# Patient Record
Sex: Female | Born: 1995 | Race: Black or African American | Hispanic: No | Marital: Single | Smoking: Never smoker
Health system: Southern US, Community
[De-identification: ages and names within clinical notes are randomized; demographics above are authoritative.]

## PROBLEM LIST (undated history)

## (undated) DIAGNOSIS — J309 Allergic rhinitis, unspecified: Secondary | ICD-10-CM

## (undated) DIAGNOSIS — D649 Anemia, unspecified: Secondary | ICD-10-CM

## (undated) DIAGNOSIS — J039 Acute tonsillitis, unspecified: Secondary | ICD-10-CM

## (undated) DIAGNOSIS — E669 Obesity, unspecified: Secondary | ICD-10-CM

## (undated) DIAGNOSIS — J45909 Unspecified asthma, uncomplicated: Secondary | ICD-10-CM

## (undated) HISTORY — PX: NO PAST SURGERIES: SHX2092

## (undated) HISTORY — PX: WISDOM TOOTH EXTRACTION: SHX21

## (undated) HISTORY — DX: Unspecified asthma, uncomplicated: J45.909

---

## 2017-05-08 ENCOUNTER — Ambulatory Visit
Admission: EM | Admit: 2017-05-08 | Discharge: 2017-05-08 | Disposition: A | Discharge status: Home / Self Care | Payer: Medicaid Other | Attending: Emergency Medicine | Admitting: Emergency Medicine

## 2017-05-08 ENCOUNTER — Encounter: Payer: Self-pay | Admitting: Gynecology

## 2017-05-08 DIAGNOSIS — Z3202 Encounter for pregnancy test, result negative: Secondary | ICD-10-CM | POA: Diagnosis not present

## 2017-05-08 DIAGNOSIS — R197 Diarrhea, unspecified: Secondary | ICD-10-CM | POA: Insufficient documentation

## 2017-05-08 DIAGNOSIS — R112 Nausea with vomiting, unspecified: Secondary | ICD-10-CM | POA: Insufficient documentation

## 2017-05-08 LAB — URINALYSIS, COMPLETE (UACMP) WITH MICROSCOPIC
BILIRUBIN URINE: NEGATIVE
GLUCOSE, UA: NEGATIVE mg/dL
Hgb urine dipstick: NEGATIVE
KETONES UR: NEGATIVE mg/dL
NITRITE: NEGATIVE
PH: 7 (ref 5.0–8.0)
Protein, ur: 30 mg/dL — AB
Specific Gravity, Urine: 1.02 (ref 1.005–1.030)

## 2017-05-08 LAB — PREGNANCY, URINE: PREG TEST UR: NEGATIVE

## 2017-05-08 MED ORDER — ONDANSETRON 8 MG PO TBDP
8.0000 mg | ORAL_TABLET | Freq: Once | ORAL | Status: AC
  Administered 2017-05-08: 8 mg via ORAL

## 2017-05-08 MED ORDER — ONDANSETRON 8 MG PO TBDP: 8.0000 mg | ORAL_TABLET | Freq: Three times a day (TID) | ORAL | 0 refills | Status: DC | PRN

## 2017-05-08 NOTE — ED Provider Notes (Signed)
HPI  SUBJECTIVE:  Gabrielle Byrd is a 21 y.o. female who presents with nonbilious, nonbloody emesis and watery diarrhea starting yesterday. She reports 4-5 episodes of vomiting yesterday and 2 today. She reports over 6 episodes of diarrhea yesterday and about 6 episodes today. She reports fevers 101 yesterday. She states that she is tolerating liquids but unable to keep any solids down. She reports diffuse burning intermittent abdominal pain lasting seconds that is worse before vomiting and having a bowel movement and better afterwards. Has no other abdominal pain at any other time.  She tried over-the-counter cold and flu medicine which she kept down without improvement in her symptoms. No change in urine output. No recent travel, medications, new foods, raw or undercooked foods, questionable leftovers. No exposure to reptiles, chickens, sick contacts although she works at [employer]. No coworkers with similar symptoms. No recent antibiotics. She does not take any supplements. She denies abdominal distention. No coughing, wheezing, chest pain, shortness of breath. No urinary complaints, change in urine output. No sore throat, ear pain, excessive thirst, lightheadedness, dizziness. No melena, hematochezia. No antipyretic in the past 6-8 hours. Past medical history negative for diabetes, hypertension, abdominal surgeries, pancreatitis, NSAID use, alcohol abuse, gallstones. LMP: 7/2. [id number], Chyrl Civatte, MD   History reviewed. No pertinent past medical history.  History reviewed. No pertinent surgical history.  Family History  Problem Relation Age of Onset  . Asthma Mother   . Rheum arthritis Mother   . Hypertension Mother   . Heart failure Father   . Diabetes Maternal Grandmother   . Rheum arthritis Maternal Grandmother   . Heart failure Maternal Grandfather   . Cancer Maternal Grandfather   . Rheum arthritis Maternal Grandfather     Social History  Substance Use Topics  . Smoking  status: Never Smoker  . Smokeless tobacco: Never Used  . Alcohol use No    No current facility-administered medications for this encounter.   Current Outpatient Prescriptions:  .  ondansetron (ZOFRAN ODT) 8 MG disintegrating tablet, Take 1 tablet (8 mg total) by mouth every 8 (eight) hours as needed for nausea or vomiting., Disp: 20 tablet, Rfl: 0  No Known Allergies   ROS  As noted in HPI.   Physical Exam  BP 115/62 (BP Location: Left Arm)   Pulse 66   Temp 98.6 F (37 C) (Oral)   Resp 16   Ht 5\' 3"  (1.6 m)   Wt 229 lb (103.9 kg)   LMP 04/19/2017   SpO2 100%   BMI 40.57 kg/m   Orthostatic VS for the past 24 hrs:  BP- Lying Pulse- Lying BP- Sitting Pulse- Sitting BP- Standing at 0 minutes Pulse- Standing at 0 minutes  05/08/17 1632 120/55 68 109/59 71 113/55 75    Constitutional: Well developed, well nourished, no acute distress Eyes:  EOMI, conjunctiva normal bilaterally HENT: Normocephalic, atraumatic,mucus membranes moist Respiratory: Normal inspiratory effort Lungs clear bilaterally Cardiovascular: Normal rate regular rhythm no murmurs rubs gallops. Cap refill less than 2 seconds GI: nondistended soft, nontender. Mild periumbilical tenderness to deep palpation. No other abdominal tenderness Negative Murphy, negative McBurney Back: No CVA tenderness skin: No rash, skin intact Musculoskeletal: no deformities Neurologic: Alert & oriented x 3, no focal neuro deficits Psychiatric: Speech and behavior appropriate   ED Course   Medications  ondansetron (ZOFRAN-ODT) disintegrating tablet 8 mg (8 mg Oral Given 05/08/17 1639)    Orders Placed This Encounter  Procedures  . Urine culture    Standing Status:  Standing    Number of Occurrences:   1    Order Specific Question:   List patient's active antibiotics    Answer:   none    Order Specific Question:   Patient immune status    Answer:   Normal  . Pregnancy, urine    Standing Status:   Standing    Number  of Occurrences:   1  . Urinalysis, Complete w Microscopic    Standing Status:   Standing    Number of Occurrences:   1  . Orthostatic vital signs    Standing Status:   Standing    Number of Occurrences:   1  . Offer Fluids    Standing Status:   Standing    Number of Occurrences:   20    Results for orders placed or performed during the hospital encounter of 05/08/17 (from the past 24 hour(s))  Pregnancy, urine     Status: None   Collection Time: 05/08/17  4:18 PM  Result Value Ref Range   Preg Test, Ur NEGATIVE NEGATIVE  Urinalysis, Complete w Microscopic     Status: Abnormal   Collection Time: 05/08/17  4:18 PM  Result Value Ref Range   Color, Urine YELLOW YELLOW   APPearance CLOUDY (A) CLEAR   Specific Gravity, Urine 1.020 1.005 - 1.030   pH 7.0 5.0 - 8.0   Glucose, UA NEGATIVE NEGATIVE mg/dL   Hgb urine dipstick NEGATIVE NEGATIVE   Bilirubin Urine NEGATIVE NEGATIVE   Ketones, ur NEGATIVE NEGATIVE mg/dL   Protein, ur 30 (A) NEGATIVE mg/dL   Nitrite NEGATIVE NEGATIVE   Leukocytes, UA MODERATE (A) NEGATIVE   Squamous Epithelial / LPF 21-50 (A) NONE SEEN   WBC, UA 21-50 0 - 5 WBC/hpf   RBC / HPF 0-5 0 - 5 RBC/hpf   Bacteria, UA FEW (A) NONE SEEN   No results found.  ED Clinical Impression  Nausea vomiting and diarrhea   ED Assessment/Plan  Giving Zofran 8 mg, oral challenge. She does not have any evidence of a surgical abdomen at this time. She is afebrile and she has not had any antipyretic in the past 6-8 hours. Checking orthostatics, UA, urine pregnancy, CMP, lipase, CBC.  Will reevaluate.  Urine pregnancy negative.   Staff was unable to draw lab work, but in the absence of abnormal vital signs, specific abdominal tenderness or change in urine output, feel that we do not necessarily need to get them today. Her urine was noted with moderate leukocytes however it is a contaminated specimen. She has no urinary complaints, so we'll send this off for culture prior to  initiating abx,   She was tolerating Pedialyte prior to discharge. She did not have any abdominal pain while here.  Cannot clearly identify any cause of her symptoms, so we'll not do stool cultures at this time. Feel this is most likely a viral gastroenteritis. Patient is not orthostatic, does not appear to be severely dehydrated.   will attempt oral rehydration at home, send home with antiemetics.  Discussed medical decision making, plan for follow-up, signs and symptoms that should prompt return to the ER.  Patient agrees with plan.  Discussed labs, imaging, MDM, plan and followup with patient and parent. Discussed sn/sx that should prompt return to the ED. Patient agrees with plan.   Meds ordered this encounter  Medications  . ondansetron (ZOFRAN-ODT) disintegrating tablet 8 mg  . ondansetron (ZOFRAN ODT) 8 MG disintegrating tablet    Sig: Take 1  tablet (8 mg total) by mouth every 8 (eight) hours as needed for nausea or vomiting.    Dispense:  20 tablet    Refill:  0    *This clinic note was created using Scientist, clinical (histocompatibility and immunogenetics)Dragon dictation software. Therefore, there may be occasional mistakes despite careful proofreading.  ?   Domenick GongMortenson, Anely Spiewak, MD 05/08/17 1711

## 2017-05-08 NOTE — ED Triage Notes (Signed)
Per patient vomiting/ diarrhea x 2 days. Per patient fever x yesterday of 101.

## 2017-05-10 LAB — URINE CULTURE: SPECIAL REQUESTS: NORMAL

## 2018-01-04 ENCOUNTER — Ambulatory Visit (INDEPENDENT_AMBULATORY_CARE_PROVIDER_SITE_OTHER): Payer: Medicaid Other | Admitting: Certified Nurse Midwife

## 2018-01-04 ENCOUNTER — Encounter: Payer: Self-pay | Admitting: Certified Nurse Midwife

## 2018-01-04 VITALS — BP 120/66 | HR 89 | Ht 63.0 in | Wt 223.4 lb

## 2018-01-04 DIAGNOSIS — Z202 Contact with and (suspected) exposure to infections with a predominantly sexual mode of transmission: Secondary | ICD-10-CM | POA: Diagnosis not present

## 2018-01-04 DIAGNOSIS — N898 Other specified noninflammatory disorders of vagina: Secondary | ICD-10-CM | POA: Diagnosis not present

## 2018-01-04 NOTE — Progress Notes (Signed)
Pt is here with c/o fishy odor with intercourse.

## 2018-01-04 NOTE — Progress Notes (Signed)
GYN ENCOUNTER NOTE  Subjective:       Gabrielle Byrd is a 22 y.o. No obstetric history on file. female is here for gynecologic evaluation of the following issues:  1. Vaginal odor x 1 month. She denies pain, burning, or increase in discharge . Stating that is she notices it with intercourse. She  denies use of birth control and condoms . She has one new partner and would like to have STD testing today .     Gynecologic History Patient's last menstrual period was 12/11/2017 (exact date). Contraception: none Last Pap: n/a  Obstetric History OB History  Gravida Para Term Preterm AB Living  0 0 0 0 0 0  SAB TAB Ectopic Multiple Live Births  0 0 0 0 0        No past medical history on file.  No past surgical history on file.  No current outpatient medications on file prior to visit.   No current facility-administered medications on file prior to visit.     No Known Allergies  Social History   Socioeconomic History  . Marital status: Single    Spouse name: Not on file  . Number of children: Not on file  . Years of education: Not on file  . Highest education level: Not on file  Social Needs  . Financial resource strain: Not on file  . Food insecurity - worry: Not on file  . Food insecurity - inability: Not on file  . Transportation needs - medical: Not on file  . Transportation needs - non-medical: Not on file  Occupational History  . Not on file  Tobacco Use  . Smoking status: Never Smoker  . Smokeless tobacco: Never Used  Substance and Sexual Activity  . Alcohol use: No  . Drug use: No  . Sexual activity: Yes    Birth control/protection: None  Other Topics Concern  . Not on file  Social History Narrative  . Not on file    Family History  Problem Relation Age of Onset  . Asthma Mother   . Rheum arthritis Mother   . Hypertension Mother   . Heart failure Father   . Diabetes Maternal Grandmother   . Rheum arthritis Maternal Grandmother   . Heart failure  Maternal Grandfather   . Cancer Maternal Grandfather   . Rheum arthritis Maternal Grandfather     The following portions of the patient's history were reviewed and updated as appropriate: allergies, current medications, past family history, past medical history, past social history, past surgical history and problem list.  Review of Systems Review of Systems - Negative except as mentioned in HPI Review of Systems - General ROS: negative for - chills, fatigue, fever, hot flashes, malaise or night sweats Hematological and Lymphatic ROS: negative for - bleeding problems or swollen lymph nodes Gastrointestinal ROS: negative for - abdominal pain, blood in stools, change in bowel habits and nausea/vomiting Musculoskeletal ROS: negative for - joint pain, muscle pain or muscular weakness Genito-Urinary ROS: negative for - change in menstrual cycle, dysmenorrhea, dyspareunia, dysuria, genital discharge, genital ulcers, hematuria, incontinence, irregular/heavy menses, nocturia or pelvic pain. Positive: vaginal odor   Objective:   BP 120/66   Pulse 89   Ht 5\' 3"  (1.6 m)   Wt 223 lb 7 oz (101.4 kg)   LMP 12/11/2017 (Exact Date)   BMI 39.58 kg/m  CONSTITUTIONAL: Well-developed, well-nourished female in no acute distress.  HENT:  Normocephalic, atraumatic.  NECK: Normal range of motion, supple, no masses.  Normal thyroid.  SKIN: Skin is warm and dry. No rash noted. Not diaphoretic. No erythema. No pallor. NEUROLGIC: Alert and oriented to person, place, and time. PSYCHIATRIC: Normal mood and affect. Normal behavior. Normal judgment and thought content. CARDIOVASCULAR:Not Examined RESPIRATORY: Not Examined BREASTS: Not Examined ABDOMEN: Soft, non distended; Non tender.  No Organomegaly. PELVIC:  External Genitalia: Normal  BUS: Normal  Vagina: Normal, white discharge no odor noted on exam  Cervix: Normal MUSCULOSKELETAL: Normal range of motion. No tenderness.  No cyanosis, clubbing, or  edema.   Assessment:  Vaginal odor  Plan:   Nuswab plus and STD labs today. Discussed use of boric acid vaginal suppository to promote natural PH balance. Will follow up with result of labs. Return PRN.   Doreene BurkeAnnie Fallen Crisostomo, CNM

## 2018-01-04 NOTE — Patient Instructions (Signed)

## 2018-01-06 LAB — NUSWAB VAGINITIS PLUS (VG+)
ATOPOBIUM VAGINAE: HIGH {score} — AB
BVAB 2: HIGH {score} — AB
CANDIDA ALBICANS, NAA: NEGATIVE
Candida glabrata, NAA: NEGATIVE
Chlamydia trachomatis, NAA: NEGATIVE
MEGASPHAERA 1: HIGH {score} — AB
Neisseria gonorrhoeae, NAA: NEGATIVE
TRICH VAG BY NAA: NEGATIVE

## 2018-01-06 LAB — HIV ANTIBODY (ROUTINE TESTING W REFLEX): HIV Screen 4th Generation wRfx: NONREACTIVE

## 2018-01-06 LAB — HSV 1 AND 2 IGM ABS, INDIRECT: HSV 1 IgM: <1:10 {titer}

## 2018-01-06 LAB — HEPATITIS B SURFACE ANTIGEN: HEP B S AG: NEGATIVE

## 2018-01-06 LAB — RPR: RPR Ser Ql: NONREACTIVE

## 2018-01-07 ENCOUNTER — Encounter: Payer: Self-pay | Admitting: Certified Nurse Midwife

## 2018-01-07 ENCOUNTER — Other Ambulatory Visit: Payer: Self-pay | Admitting: Certified Nurse Midwife

## 2018-01-07 MED ORDER — METRONIDAZOLE 500 MG PO TABS
500.0000 mg | ORAL_TABLET | Freq: Two times a day (BID) | ORAL | 0 refills | Status: AC
Start: 2018-01-07 — End: 2018-01-14

## 2018-01-07 NOTE — Progress Notes (Signed)
Pt nuswab positive for BV, pt notified via my chart.   Doreene BurkeAnnie Diasha Castleman, CNM

## 2018-01-12 ENCOUNTER — Encounter: Payer: Self-pay | Admitting: Certified Nurse Midwife

## 2018-01-13 ENCOUNTER — Encounter: Payer: Self-pay | Admitting: Certified Nurse Midwife

## 2018-02-15 ENCOUNTER — Encounter: Payer: Self-pay | Admitting: Certified Nurse Midwife

## 2018-02-15 MED ORDER — METRONIDAZOLE 500 MG PO TABS: 500.0000 mg | ORAL_TABLET | Freq: Two times a day (BID) | ORAL | 0 refills | Status: AC

## 2018-02-21 ENCOUNTER — Encounter: Payer: Self-pay | Admitting: Certified Nurse Midwife

## 2018-02-21 ENCOUNTER — Other Ambulatory Visit: Payer: Self-pay | Admitting: Certified Nurse Midwife

## 2018-04-04 ENCOUNTER — Encounter: Payer: Self-pay | Admitting: Certified Nurse Midwife

## 2018-04-06 ENCOUNTER — Other Ambulatory Visit: Payer: Self-pay | Admitting: Certified Nurse Midwife

## 2018-04-06 ENCOUNTER — Encounter: Payer: Self-pay | Admitting: Certified Nurse Midwife

## 2018-04-06 ENCOUNTER — Ambulatory Visit (INDEPENDENT_AMBULATORY_CARE_PROVIDER_SITE_OTHER): Payer: Medicaid Other | Admitting: Certified Nurse Midwife

## 2018-04-06 VITALS — BP 114/72 | HR 87 | Ht 63.0 in | Wt 226.1 lb

## 2018-04-06 DIAGNOSIS — E669 Obesity, unspecified: Secondary | ICD-10-CM | POA: Diagnosis not present

## 2018-04-06 DIAGNOSIS — IMO0001 Reserved for inherently not codable concepts without codable children: Secondary | ICD-10-CM

## 2018-04-06 DIAGNOSIS — Z789 Other specified health status: Secondary | ICD-10-CM | POA: Diagnosis not present

## 2018-04-06 LAB — POCT URINE PREGNANCY: PREG TEST UR: NEGATIVE

## 2018-04-06 MED ORDER — NORETHIN-ETH ESTRAD-FE BIPHAS 1 MG-10 MCG / 10 MCG PO TABS: 1.0000 | ORAL_TABLET | Freq: Every day | ORAL | 11 refills | Status: DC

## 2018-04-06 NOTE — Patient Instructions (Signed)

## 2018-04-06 NOTE — Progress Notes (Signed)
Pt would like to start OCPs.

## 2018-04-06 NOTE — Progress Notes (Signed)
Subjective:    Gabrielle Byrd is a 22 y.o. female who presents for contraception counseling. The patient has complaints of increase of discharge with fishy odor. Has history of BV. The patient is sexually active. Pertinent past medical history: none.  Menstrual History: OB History    Gravida  0   Para  0   Term  0   Preterm  0   AB  0   Living  0     SAB  0   TAB  0   Ectopic  0   Multiple  0   Live Births  0          Patient's last menstrual period was 03/15/2018 (exact date).    The following portions of the patient's history were reviewed and updated as appropriate:  She  has no past medical history on file. She does not have a problem list on file. She  has no past surgical history on file. Her family history includes Asthma in her mother; Cancer in her maternal grandfather; Diabetes in her maternal grandmother; Heart failure in her father and maternal grandfather; Hypertension in her mother; Rheum arthritis in her maternal grandfather, maternal grandmother, and mother. She  reports that she has never smoked. She has never used smokeless tobacco. She reports that she does not drink alcohol or use drugs. She has a current medication list which includes the following prescription(s): ibuprofen, norethindrone-ethinyl estradiol-fe biphas, and proair hfa. Current Outpatient Medications on File Prior to Visit  Medication Sig Dispense Refill  . ibuprofen (ADVIL,MOTRIN) 800 MG tablet Take 800 mg by mouth 3 (three) times daily as needed.  0  . PROAIR HFA 108 (90 Base) MCG/ACT inhaler USE 2 INHALATIONS EVERY FOUR TO SIX HOURS AS NEEDED  5   No current facility-administered medications on file prior to visit.    She has No Known Allergies..  Review of Systems Pertinent items are noted in HPI.   Objective:    No exam performed today, Not indicated.   Assessment:    22 y.o., starting OCP (estrogen/progesterone), no contraindications.   Plan:  Discussed BC  options including NuvaRing,IUD,Patch, and Nexplanon. Patient request OCP,reviewed instructions for use Norethindrone-Ethinyl Estradiol-Fe ordered. Refill on Flagyl. Encouraged use of Boric Acid capsules.  Discussed backup for use and preventions of STI. Follow-up for annual exam and as needed.  I attest that more than 50% of visit spent discussing birth control options, use, and contraindications. Face to Face time 10 minute visit.  Shanika Creacy,SNM/Rexine Gowens,CNM

## 2018-05-03 ENCOUNTER — Encounter: Payer: Self-pay | Admitting: Certified Nurse Midwife

## 2018-05-04 ENCOUNTER — Encounter: Payer: Medicaid Other | Admitting: Certified Nurse Midwife

## 2018-05-09 ENCOUNTER — Ambulatory Visit (INDEPENDENT_AMBULATORY_CARE_PROVIDER_SITE_OTHER): Payer: Medicaid Other | Admitting: Certified Nurse Midwife

## 2018-05-09 VITALS — BP 97/60 | HR 75 | Ht 63.0 in | Wt 228.2 lb

## 2018-05-09 DIAGNOSIS — N926 Irregular menstruation, unspecified: Secondary | ICD-10-CM

## 2018-05-09 DIAGNOSIS — L83 Acanthosis nigricans: Secondary | ICD-10-CM

## 2018-05-09 DIAGNOSIS — N898 Other specified noninflammatory disorders of vagina: Secondary | ICD-10-CM

## 2018-05-09 DIAGNOSIS — L680 Hirsutism: Secondary | ICD-10-CM

## 2018-05-09 DIAGNOSIS — L709 Acne, unspecified: Secondary | ICD-10-CM

## 2018-05-09 MED ORDER — DROSPIRENONE-ETHINYL ESTRADIOL 3-0.02 MG PO TABS: 1.0000 | ORAL_TABLET | Freq: Every day | ORAL | 4 refills | Status: DC

## 2018-05-09 NOTE — Patient Instructions (Addendum)
Vaginitis Vaginitis is a condition in which the vaginal tissue swells and becomes red (inflamed). This condition is most often caused by a change in the normal balance of bacteria and yeast that live in the vagina. This change causes an overgrowth of certain bacteria or yeast, which causes the inflammation. There are different types of vaginitis, but the most common types are:  Bacterial vaginosis.  Yeast infection (candidiasis).  Trichomoniasis vaginitis. This is a sexually transmitted disease (STD).  Viral vaginitis.  Atrophic vaginitis.  Allergic vaginitis.  What are the causes? The cause of this condition depends on the type of vaginitis. It can be caused by:  Bacteria (bacterial vaginosis).  Yeast, which is a fungus (yeast infection).  A parasite (trichomoniasis vaginitis).  A virus (viral vaginitis).  Low hormone levels (atrophic vaginitis). Low hormone levels can occur during pregnancy, breastfeeding, or after menopause.  Irritants, such as bubble baths, scented tampons, and feminine sprays (allergic vaginitis).  Other factors can change the normal balance of the yeast and bacteria that live in the vagina. These include:  Antibiotic medicines.  Poor hygiene.  Diaphragms, vaginal sponges, spermicides, birth control pills, and intrauterine devices (IUD).  Sex.  Infection.  Uncontrolled diabetes.  A weakened defense (immune) system.  What increases the risk? This condition is more likely to develop in women who:  Smoke.  Use vaginal douches, scented tampons, or scented sanitary pads.  Wear tight-fitting pants.  Wear thong underwear.  Use oral birth control pills or an IUD.  Have sex without a condom.  Have multiple sex partners.  Have an STD.  Frequently use the spermicide nonoxynol-9.  Eat lots of foods high in sugar.  Have uncontrolled diabetes.  Have low estrogen levels.  Have a weakened immune system from an immune disorder or medical  treatment.  Are pregnant or breastfeeding.  What are the signs or symptoms? Symptoms vary depending on the cause of the vaginitis. Common symptoms include:  Abnormal vaginal discharge. ? The discharge is white, gray, or yellow with bacterial vaginosis. ? The discharge is thick, white, and cheesy with a yeast infection. ? The discharge is frothy and yellow or greenish with trichomoniasis.  A bad vaginal smell. The smell is fishy with bacterial vaginosis.  Vaginal itching, pain, or swelling.  Sex that is painful.  Pain or burning when urinating.  Sometimes there are no symptoms. How is this diagnosed? This condition is diagnosed based on your symptoms and medical history. A physical exam, including a pelvic exam, will also be done. You may also have other tests, including:  Tests to determine the pH level (acidity or alkalinity) of your vagina.  A whiff test, to assess the odor that results when a sample of your vaginal discharge is mixed with a potassium hydroxide solution.  Tests of vaginal fluid. A sample will be examined under a microscope.  How is this treated? Treatment varies depending on the type of vaginitis you have. Your treatment may include:  Antibiotic creams or pills to treat bacterial vaginosis and trichomoniasis.  Antifungal medicines, such as vaginal creams or suppositories, to treat a yeast infection.  Medicine to ease discomfort if you have viral vaginitis. Your sexual partner should also be treated.  Estrogen delivered in a cream, pill, suppository, or vaginal ring to treat atrophic vaginitis. If vaginal dryness occurs, lubricants and moisturizing creams may help. You may need to avoid scented soaps, sprays, or douches.  Stopping use of a product that is causing allergic vaginitis. Then using a vaginal  cream to treat the symptoms.  Follow these instructions at home: Lifestyle  Keep your genital area clean and dry. Avoid soap, and only rinse the area  with water.  Do not douche or use tampons until your health care provider says it is okay to do so. Use sanitary pads, if needed.  Do not have sex until your health care provider approves. When you can return to sex, practice safe sex and use condoms.  Wipe from front to back. This avoids the spread of bacteria from the rectum to the vagina. General instructions  Take over-the-counter and prescription medicines only as told by your health care provider.  If you were prescribed an antibiotic medicine, take or use it as told by your health care provider. Do not stop taking or using the antibiotic even if you start to feel better.  Keep all follow-up visits as told by your health care provider. This is important. How is this prevented?  Use mild, non-scented products. Do not use things that can irritate the vagina, such as fabric softeners. Avoid the following products if they are scented: ? Feminine sprays. ? Detergents. ? Tampons. ? Feminine hygiene products. ? Soaps or bubble baths.  Let air reach your genital area. ? Wear cotton underwear to reduce moisture buildup. ? Avoid wearing underwear while you sleep. ? Avoid wearing tight pants and underwear or nylons without a cotton panel. ? Avoid wearing thong underwear.  Take off any wet clothing, such as bathing suits, as soon as possible.  Practice safe sex and use condoms. Contact a health care provider if:  You have abdominal pain.  You have a fever.  You have symptoms that last for more than 2-3 days. Get help right away if:  You have a fever and your symptoms suddenly get worse. Summary  Vaginitis is a condition in which the vaginal tissue becomes inflamed.This condition is most often caused by a change in the normal balance of bacteria and yeast that live in the vagina.  Treatment varies depending on the type of vaginitis you have.  Do not douche, use tampons , or have sex until your health care provider approves.  When you can return to sex, practice safe sex and use condoms. This information is not intended to replace advice given to you by your health care provider. Make sure you discuss any questions you have with your health care provider. Document Released: 08/02/2007 Document Revised: 11/10/2016 Document Reviewed: 11/10/2016 Elsevier Interactive Patient Education  2018 [employer]. Drospirenone; Ethinyl Estradiol tablets What is this medicine? DROSPIRENONE; ETHINYL ESTRADIOL (dro SPY re nown; ETH in il es tra DYE ole) is an oral contraceptive (birth control pill). This medicine combines two types of female hormones, an estrogen and a progestin. It is used to prevent ovulation and pregnancy. This medicine may be used for other purposes; ask your health care provider or pharmacist if you have questions. COMMON BRAND NAME(S): Evelena Peat What should I tell my health care provider before I take this medicine? They need to know if you have or ever had any of these conditions: -abnormal vaginal bleeding -adrenal gland disease -blood vessel disease or blood clots -breast, cervical, endometrial, ovarian, liver, or uterine cancer -diabetes -gallbladder disease -heart disease or recent heart attack -high blood pressure -high cholesterol -high potassium level -kidney disease -liver disease -migraine headaches -stroke -systemic lupus erythematosus (SLE) -tobacco smoker -an unusual or allergic reaction to estrogens, progestins, or other medicines,  foods, dyes, or preservatives -pregnant or trying to get pregnant -breast-feeding How should I use this medicine? Take this medicine by mouth. To reduce nausea, this medicine may be taken with food. Follow the directions on the prescription label. Take this medicine at the same time each day and in the order directed on the package. Do not take your medicine more often than directed. A patient  package insert for the product will be given with each prescription and refill. Read this sheet carefully each time. The sheet may change frequently. Talk to your pediatrician regarding the use of this medicine in children. Special care may be needed. This medicine has been used in female children who have started having menstrual periods. Overdosage: If you think you have taken too much of this medicine contact a poison control center or emergency room at once. NOTE: This medicine is only for you. Do not share this medicine with others. What if I miss a dose? If you miss a dose, refer to the patient information sheet you received with your medicine for direction. If you miss more than one pill, this medicine may not be as effective and you may need to use another form of birth control. What may interact with this medicine? Do not take this medicine with any of the following medications: -aminoglutethimide -amprenavir, fosamprenavir -atazanavir; cobicistat -anastrozole -bosentan -exemestane -letrozole -metyrapone -testolactone This medicine may also interact with the following medications: -acetaminophen -antiviral medicines for HIV or AIDS -aprepitant -barbiturates -certain antibiotics like rifampin, rifabutin, rifapentine, and possibly penicillins or tetracyclines -certain diuretics like amiloride, spironolactone, triamterene -certain medicines for fungal infections like griseofulvin, ketoconazole, itraconazole -certain medications for high blood pressure or heart conditions like ACE-inhibitors, Angiotensin-II receptor blockers, eplerenone -certain medicines for seizures like carbamazepine, oxcarbazepine, phenobarbital, phenytoin -cholestyramine -cobicistat -corticosteroid like hydrocortisone and prednisolone -cyclosporine -dantrolene -felbamate -grapefruit juice -heparin -lamotrigine -medicines for diabetes, including pioglitazone -modafinil -NSAIDs -potassium  supplements -pyrimethamine -raloxifene -St. John's wort -sulfasalazine -tamoxifen -topiramate -thyroid hormones -warfarin his list may not describe all possible interactions. Give your health care provider a list of all the medicines, herbs, non-prescription drugs, or dietary supplements you use. Also tell them if you smoke, drink alcohol, or use illegal drugs. Some items may interact with your medicine. This list may not describe all possible interactions. Give your health care provider a list of all the medicines, herbs, non-prescription drugs, or dietary supplements you use. Also tell them if you smoke, drink alcohol, or use illegal drugs. Some items may interact with your medicine. What should I watch for while using this medicine? Visit your doctor or health care professional for regular checks on your progress. You will need a regular breast and pelvic exam and Pap smear while on this medicine. Use an additional method of contraception during the first cycle that you take these tablets. If you have any reason to think you are pregnant, stop taking this medicine right away and contact your doctor or health care professional. If you are taking this medicine for hormone related problems, it may take several cycles of use to see improvement in your condition. Smoking increases the risk of getting a blood clot or having a stroke while you are taking birth control pills, especially if you are more than 20 years old. You are strongly advised not to smoke. This medicine can make your body retain fluid, making your fingers, hands, or ankles swell. Your blood pressure can go up. Contact your doctor or health care professional if you feel you are  retaining fluid. This medicine can make you more sensitive to the sun. Keep out of the sun. If you cannot avoid being in the sun, wear protective clothing and use sunscreen. Do not use sun lamps or tanning beds/booths. If you wear contact lenses and notice  visual changes, or if the lenses begin to feel uncomfortable, consult your eye care specialist. In some women, tenderness, swelling, or minor bleeding of the gums may occur. Notify your dentist if this happens. Brushing and flossing your teeth regularly may help limit this. See your dentist regularly and inform your dentist of the medicines you are taking. If you are going to have elective surgery, you may need to stop taking this medicine before the surgery. Consult your health care professional for advice. This medicine does not protect you against HIV infection (AIDS) or any other sexually transmitted diseases. What side effects may I notice from receiving this medicine? Side effects that you should report to your doctor or health care professional as soon as possible: -allergic reactions like skin rash, itching or hives, swelling of the face, lips, or tongue -breast tissue changes or discharge -changes in vision -chest pain -confusion, trouble speaking or understanding -dark urine -general ill feeling or flu-like symptoms -light-colored stools -nausea, vomiting -pain, swelling, warmth in the leg -right upper belly pain -severe headaches -shortness of breath -sudden numbness or weakness of the face, arm or leg -trouble walking, dizziness, loss of balance or coordination -unusual vaginal bleeding -yellowing of the eyes or skin Side effects that usually do not require medical attention (report to your doctor or health care professional if they continue or are bothersome): -acne -brown spots on the face -change in appetite -change in sexual desire -depressed mood or mood swings -fluid retention and swelling -stomach cramps or bloating -unusually weak or tired -weight gain This list may not describe all possible side effects. Call your doctor for medical advice about side effects. You may report side effects to FDA at 1-800-FDA-1088. Where should I keep my medicine? Keep out of the  reach of children. Store at room temperature between 15 and 30 degrees C (59 and 86 degrees F). Throw away any unused medicine after the expiration date. NOTE: This sheet is a summary. It may not cover all possible information. If you have questions about this medicine, talk to your doctor, pharmacist, or health care provider.  2018 Elsevier/Gold Standard (2016-06-26 13:52:56)

## 2018-05-09 NOTE — Progress Notes (Signed)
Pt is here with c/o whitish yellow discharge with odor. Gets worse with intercourse.

## 2018-05-10 DIAGNOSIS — L709 Acne, unspecified: Secondary | ICD-10-CM | POA: Insufficient documentation

## 2018-05-10 DIAGNOSIS — L680 Hirsutism: Secondary | ICD-10-CM | POA: Insufficient documentation

## 2018-05-10 DIAGNOSIS — N926 Irregular menstruation, unspecified: Secondary | ICD-10-CM | POA: Insufficient documentation

## 2018-05-10 DIAGNOSIS — L83 Acanthosis nigricans: Secondary | ICD-10-CM | POA: Insufficient documentation

## 2018-05-10 NOTE — Progress Notes (Signed)
GYN ENCOUNTER NOTE  Subjective:       Gabrielle Byrd is a 22 y.o. G0P0000 female here for gynecologic evaluation of the following issues:  1. Vaginal discharge 2. Vaginal odor 3. Irregular menses 4. Acne 5. Excessive hair  6. Acanthosis nigricans  Reports white yellow vaginal discharge and odor with mild itching for the last three (3) months. No relief with home treatment measures, started using [place] V approximately two (2) weeks ago.   Previously seen by Doreene Burke, CNM for contraception on 04/06/2018 and started on Lo Loestrin, but stopped taking the pill due to increased vaginal bleeding. Reports history of irregular menses, acne, excessive hair and acanthosis nigricans.   Denies difficulty breathing or respiratory distress, chest pain, abdominal pain, excessive vaginal bleeding, dysuria, and leg pain or swelling.    Gynecologic History  Patient's last menstrual period was 04/19/2018 (exact date).  Contraception: OCP (estrogen/progesterone)  Last Pap: N/A.   Obstetric History OB History  Gravida Para Term Preterm AB Living  0 0 0 0 0 0  SAB TAB Ectopic Multiple Live Births  0 0 0 0 0    Current Outpatient Medications on File Prior to Visit  Medication Sig Dispense Refill  . ibuprofen (ADVIL,MOTRIN) 800 MG tablet Take 800 mg by mouth 3 (three) times daily as needed.  0  . PROAIR HFA 108 (90 Base) MCG/ACT inhaler USE 2 INHALATIONS EVERY FOUR TO SIX HOURS AS NEEDED  5   No current facility-administered medications on file prior to visit.     No Known Allergies  Social History   Socioeconomic History  . Marital status: Single    Spouse name: Not on file  . Number of children: Not on file  . Years of education: Not on file  . Highest education level: Not on file  Occupational History  . Not on file  Social Needs  . Financial resource strain: Not on file  . Food insecurity:    Worry: Not on file    Inability: Not on file  . Transportation needs:   Medical: Not on file    Non-medical: Not on file  Tobacco Use  . Smoking status: Never Smoker  . Smokeless tobacco: Never Used  Substance and Sexual Activity  . Alcohol use: No  . Drug use: No  . Sexual activity: Yes    Birth control/protection: None  Lifestyle  . Physical activity:    Days per week: Not on file    Minutes per session: Not on file  . Stress: Not on file  Relationships  . Social connections:    Talks on phone: Not on file    Gets together: Not on file    Attends religious service: Not on file    Active member of club or organization: Not on file    Attends meetings of clubs or organizations: Not on file    Relationship status: Not on file  . Intimate partner violence:    Byrd of current or ex partner: Not on file    Emotionally abused: Not on file    Physically abused: Not on file    Forced sexual activity: Not on file  Other Topics Concern  . Not on file  Social History Narrative  . Not on file    Family History  Problem Relation Age of Onset  . Asthma Mother   . Rheum arthritis Mother   . Hypertension Mother   . Heart failure Father   . Diabetes Maternal Grandmother   .  Rheum arthritis Maternal Grandmother   . Heart failure Maternal Grandfather   . Cancer Maternal Grandfather   . Rheum arthritis Maternal Grandfather     The following portions of the patient's history were reviewed and updated as appropriate: allergies, current medications, past family history, past medical history, past social history, past surgical history and problem list.  Review of Systems  ROS negative except as noted above. Information obtained from patient.   Objective:   BP 97/60   Pulse 75   Ht 5\' 3"  (1.6 m)   Wt 228 lb 4 oz (103.5 kg)   LMP 04/19/2018 (Exact Date)   BMI 40.43 kg/m    CONSTITUTIONAL: Well-developed, well-nourished female in no acute distress.   SKIN: Skin is warm and dry. No rash noted. Not diaphoretic. No erythema. No pallor. Acne present on  face and trunk. Dark areas to neck and underarms noted.   ABDOMEN: Soft, non distended; Non tender.  No Organomegaly.  PELVIC:  External Genitalia: Normal  Vagina: Normal  Cervix: Normal, NuSwab collected  Uterus: Normal size, shape,consistency, mobile  Adnexa: Normal   MUSCULOSKELETAL: Normal range of motion. No tenderness.  No cyanosis, clubbing, or edema.  Assessment:   1. Vaginal discharge  - NuSwab Vaginitis Plus (VG+)  2. Vaginal odor  - NuSwab Vaginitis Plus (VG+)  3. Irregular menses   4. Acne, unspecified acne type   5. Excessive hair on females   6. Acanthosis nigricans    Plan:   Labs: NuSwab collected, will contact patient with results.   Rx: Yaz, see orders.   Reviewed red flag symptoms and when to call.   RTC as needed.    Gunnar BullaJenkins Michelle Bailie Christenbury, CNM [facility], [facility]

## 2018-05-13 LAB — NUSWAB VAGINITIS PLUS (VG+)
Atopobium vaginae: HIGH Score — AB
BVAB 2: HIGH Score — AB
CANDIDA ALBICANS, NAA: POSITIVE — AB
Candida glabrata, NAA: NEGATIVE
Chlamydia trachomatis, NAA: NEGATIVE
MEGASPHAERA 1: HIGH {score} — AB
NEISSERIA GONORRHOEAE, NAA: NEGATIVE
TRICH VAG BY NAA: NEGATIVE

## 2018-05-16 ENCOUNTER — Encounter: Payer: Self-pay | Admitting: Certified Nurse Midwife

## 2018-05-16 ENCOUNTER — Telehealth: Payer: Self-pay | Admitting: Certified Nurse Midwife

## 2018-05-16 ENCOUNTER — Other Ambulatory Visit: Payer: Self-pay | Admitting: Certified Nurse Midwife

## 2018-05-16 MED ORDER — SECNIDAZOLE 2 G PO PACK: 1.0000 | PACK | Freq: Once | ORAL | 0 refills | Status: AC

## 2018-05-16 MED ORDER — FLUCONAZOLE 150 MG PO TABS: 150.0000 mg | ORAL_TABLET | Freq: Once | ORAL | 0 refills | Status: AC

## 2018-05-16 NOTE — Telephone Encounter (Signed)
Patient called requesting lab results. Thanks.

## 2018-05-23 ENCOUNTER — Encounter: Payer: Self-pay | Admitting: Certified Nurse Midwife

## 2018-05-23 ENCOUNTER — Telehealth: Payer: Self-pay

## 2018-05-23 NOTE — Telephone Encounter (Signed)
Please contact patient and discuss symptoms. May benefit from boric acid capsules (will need to use compounding pharmacy) nightly x 7-14 days if symptoms persist with Solosec and Diflucan. Thanks, JML

## 2018-05-23 NOTE — Telephone Encounter (Signed)
Attempted to contact pt- mother states she is not at home. Left message with mother to please have pt send me a mychart message when she is able.

## 2018-05-24 ENCOUNTER — Other Ambulatory Visit: Payer: Self-pay

## 2018-05-24 NOTE — Telephone Encounter (Signed)
Spoke with pt- she states she has the same symptoms of BV and yeast despite using Solosec and diflucan. Per ML can try Boric Acid caps. I believe [facility] is the [facility]. Please put order in. Thanks

## 2018-05-25 ENCOUNTER — Encounter: Payer: Self-pay | Admitting: Certified Nurse Midwife

## 2018-05-25 ENCOUNTER — Other Ambulatory Visit: Payer: Self-pay | Admitting: Certified Nurse Midwife

## 2018-05-25 MED ORDER — BORIC ACID CRYS: 600.0000 mg | CRYSTALS | Freq: Every day | 1 refills | Status: DC

## 2018-05-25 MED ORDER — BORIC ACID CRYS: 600.0000 mg | CRYSTALS | Freq: Every day | 1 refills | Status: AC

## 2018-05-25 NOTE — Progress Notes (Signed)
Rx: Boric acid, see orders.  Sent to [facility] as requested.    Gunnar BullaJenkins Michelle Lawhorn, CNM [facility], [facility]

## 2018-06-06 ENCOUNTER — Encounter: Payer: Self-pay | Admitting: Certified Nurse Midwife

## 2018-06-09 ENCOUNTER — Encounter: Payer: Medicaid Other | Admitting: Certified Nurse Midwife

## 2018-06-09 ENCOUNTER — Ambulatory Visit: Payer: Medicaid Other | Admitting: Certified Nurse Midwife

## 2018-06-09 VITALS — BP 109/70 | HR 80 | Ht 63.0 in | Wt 226.0 lb

## 2018-06-09 DIAGNOSIS — N898 Other specified noninflammatory disorders of vagina: Secondary | ICD-10-CM

## 2018-06-09 DIAGNOSIS — N926 Irregular menstruation, unspecified: Secondary | ICD-10-CM

## 2018-06-09 DIAGNOSIS — Z6841 Body Mass Index (BMI) 40.0 and over, adult: Secondary | ICD-10-CM

## 2018-06-09 NOTE — Progress Notes (Signed)
GYN ENCOUNTER NOTE  Subjective:       Gabrielle Byrd is a 22 y.o. G0P0000 female is here for gynecologic evaluation of the following issues:  1. Vaginal discharge 2. Vaginal odor 3. Irregular menses 4. Questions fertility  LOV: 05/09/2018. Reports vaginal discharge and odor since finished treatment. Endorses previous attempt at pregnancy that lasted possible two (2) months until start of OCP.   Denies difficulty breathing or respiratory distress, chest pain, abdominal pain, excessive vaginal bleeding, dysuria, and leg pain or swelling.   Accompanied by mother.    Gynecologic History  Patient's last menstrual period was 06/07/2018 (exact date).  Contraception: OCP (estrogen/progesterone)  Last Pap: N/A.   Obstetric History  OB History  Gravida Para Term Preterm AB Living  0 0 0 0 0 0  SAB TAB Ectopic Multiple Live Births  0 0 0 0 0    Current Outpatient Medications on File Prior to Visit  Medication Sig Dispense Refill  . drospirenone-ethinyl estradiol (YAZ) 3-0.02 MG tablet Take 1 tablet by mouth daily. 3 Package 4  . ibuprofen (ADVIL,MOTRIN) 800 MG tablet Take 800 mg by mouth 3 (three) times daily as needed.  0  . PROAIR HFA 108 (90 Base) MCG/ACT inhaler USE 2 INHALATIONS EVERY FOUR TO SIX HOURS AS NEEDED  5  . amoxicillin (AMOXIL) 875 MG tablet Take 875 mg by mouth 2 (two) times daily.  0  . Cholecalciferol (VITAMIN D3) 50000 units CAPS TAKE ONE CAPSULE BY MOUTH ONE TIME PER WEEK  3  . fluticasone (FLONASE) 50 MCG/ACT nasal spray Place 2 sprays into both nostrils daily.  5   No current facility-administered medications on file prior to visit.     No Known Allergies  Social History   Socioeconomic History  . Marital status: Single    Spouse name: Not on file  . Number of children: Not on file  . Years of education: Not on file  . Highest education level: Not on file  Occupational History  . Not on file  Social Needs  . Financial resource strain: Not on  file  . Food insecurity:    Worry: Not on file    Inability: Not on file  . Transportation needs:    Medical: Not on file    Non-medical: Not on file  Tobacco Use  . Smoking status: Never Smoker  . Smokeless tobacco: Never Used  Substance and Sexual Activity  . Alcohol use: No  . Drug use: No  . Sexual activity: Yes    Birth control/protection: None  Lifestyle  . Physical activity:    Days per week: Not on file    Minutes per session: Not on file  . Stress: Not on file  Relationships  . Social connections:    Talks on phone: Not on file    Gets together: Not on file    Attends religious service: Not on file    Active member of club or organization: Not on file    Attends meetings of clubs or organizations: Not on file    Relationship status: Not on file  . Intimate partner violence:    Fear of current or ex partner: Not on file    Emotionally abused: Not on file    Physically abused: Not on file    Forced sexual activity: Not on file  Other Topics Concern  . Not on file  Social History Narrative  . Not on file    Family History  Problem Relation Age of  Onset  . Asthma Mother   . Rheum arthritis Mother   . Hypertension Mother   . Heart failure Father   . Diabetes Maternal Grandmother   . Rheum arthritis Maternal Grandmother   . Heart failure Maternal Grandfather   . Cancer Maternal Grandfather   . Rheum arthritis Maternal Grandfather     The following portions of the patient's history were reviewed and updated as appropriate: allergies, current medications, past family history, past medical history, past social history, past surgical history and problem list.  Review of Systems  ROS negative except as noted above. Information obtained from patient and mother.   Objective:   BP 109/70   Pulse 80   Ht 5\' 3"  (1.6 m)   Wt 226 lb (102.5 kg)   LMP 06/07/2018 (Exact Date)   BMI 40.03 kg/m    CONSTITUTIONAL: Well-developed, well-nourished female in no acute  distress.   ABDOMEN: Soft, non distended; Non tender.  No Organomegaly. Obese.   PELVIC:  External Genitalia: Normal  Vagina: Normal  Cervix: Normal  Uterus: Normal size, shape,consistency, mobile  Adnexa: Normal  Assessment:   1. Vaginal discharge  - NuSwab Vaginitis (VG)  2. Vaginal odor  - NuSwab Vaginitis (VG)  3. Irregular menses  - DHEA-sulfate - Prolactin - Testosterone, Free, Total, SHBG - TSH - Progesterone - FSH/LH - Comprehensive metabolic panel - NuSwab Vaginitis (VG)  4. BMI > 40  Plan:   Labs: See orders.   Referral to Medical Nutrition therapy, see orders.   Discussed possible cause of recurrent BV and definition of infertility, patient and mother verbalized understanding.   Reviewed red flag symptoms and when to call.   RTC x 1-2 weeks for US and results review or sooner if needed.    Gunnar BullaJenkins Michelle Davonne Baby, CNM [facility], [facility]

## 2018-06-09 NOTE — Progress Notes (Signed)
Pt is here to discuss fertility.

## 2018-06-09 NOTE — Patient Instructions (Signed)
Vaginitis Vaginitis is an inflammation of the vagina. It can happen when the normal bacteria and yeast in the vagina grow too much. There are different types. Treatment will depend on the type you have. Follow these instructions at home:  Take all medicines as told by your doctor.  Keep your vagina area clean and dry. Avoid soap. Rinse the area with water.  Avoid washing and cleaning out the vagina (douching).  Do not use tampons or have sex (intercourse) until your treatment is done.  Wipe from front to back after going to the restroom.  Wear cotton underwear.  Avoid wearing underwear while you sleep until your vaginitis is gone.  Avoid tight pants. Avoid underwear or nylons without a cotton panel.  Take off wet clothing (such as a bathing suit) as soon as you can.  Use mild, unscented products. Avoid fabric softeners and scented: ? Feminine sprays. ? Laundry detergents. ? Tampons. ? Soaps or bubble baths.  Practice safe sex and use condoms. Get help right away if:  You have belly (abdominal) pain.  You have a fever or lasting symptoms for more than 2-3 days.  You have a fever and your symptoms suddenly get worse. This information is not intended to replace advice given to you by your health care provider. Make sure you discuss any questions you have with your health care provider. Document Released: 01/01/2009 Document Revised: 03/12/2016 Document Reviewed: 03/17/2012 Elsevier Interactive Patient Education  2017 [employer] Abnormal Uterine Bleeding Abnormal uterine bleeding means bleeding more than usual from your uterus. It can include:  Bleeding between periods.  Bleeding after sex.  Bleeding that is heavier than normal.  Periods that last longer than usual.  Bleeding after you have stopped having your period (menopause).  There are many problems that may cause this. You should see a doctor for any kind of bleeding that is not normal. Treatment depends on  the cause of the bleeding. Follow these instructions at home:  Watch your condition for any changes.  Do not use tampons, douche, or have sex, if your doctor tells you not to.  Change your pads often.  Get regular well-woman exams. Make sure they include a pelvic exam and cervical cancer screening.  Keep all follow-up visits as told by your doctor. This is important. Contact a doctor if:  The bleeding lasts more than one week.  You feel dizzy at times.  You feel like you are going to throw up (nauseous).  You throw up. Get help right away if:  You pass out.  You have to change pads every hour.  You have belly (abdominal) pain.  You have a fever.  You get sweaty.  You get weak.  You passing large blood clots from your vagina. Summary  Abnormal uterine bleeding means bleeding more than usual from your uterus.  There are many problems that may cause this. You should see a doctor for any kind of bleeding that is not normal.  Treatment depends on the cause of the bleeding. This information is not intended to replace advice given to you by your health care provider. Make sure you discuss any questions you have with your health care provider. Document Released: 08/02/2009 Document Revised: 09/29/2016 Document Reviewed: 09/29/2016 Elsevier Interactive Patient Education  2017 [employer] Polycystic Ovarian Syndrome Polycystic ovarian syndrome (PCOS) is a common hormonal disorder among women of reproductive age. In most women with PCOS, many small fluid-filled sacs (cysts) grow on the ovaries, and the cysts are not  part of a normal menstrual cycle. PCOS can cause problems with your menstrual periods and make it difficult to get pregnant. It can also cause an increased risk of miscarriage with pregnancy. If it is not treated, PCOS can lead to serious health problems, such as diabetes and heart disease. What are the causes? The cause of PCOS is not known, but it may be the  result of a combination of certain factors, such as:  Irregular menstrual cycle.  High levels of certain hormones (androgens).  Problems with the hormone that helps to control blood sugar (insulin resistance).  Certain genes.  What increases the risk? This condition is more likely to develop in women who have a family history of PCOS. What are the signs or symptoms? Symptoms of PCOS may include:  Multiple ovarian cysts.  Infrequent periods or no periods.  Periods that are too frequent or too heavy.  Unpredictable periods.  Inability to get pregnant (infertility) because of not ovulating.  Increased growth of hair on the face, chest, stomach, back, thumbs, thighs, or toes.  Acne or oily skin. Acne may develop during adulthood, and it may not respond to treatment.  Pelvic pain.  Weight gain or obesity.  Patches of thickened and dark brown or black skin on the neck, arms, breasts, or thighs (acanthosis nigricans).  Excess hair growth on the face, chest, abdomen, or upper thighs (hirsutism).  How is this diagnosed? This condition is diagnosed based on:  Your medical history.  A physical exam, including a pelvic exam. Your health care provider may look for areas of increased hair growth on your skin.  Tests, such as: ? Ultrasound. This may be used to examine the ovaries and the lining of the uterus (endometrium) for cysts. ? Blood tests. These may be used to check levels of sugar (glucose), female hormone (testosterone), and female hormones (estrogen and progesterone) in your blood.  How is this treated? There is no cure for PCOS, but treatment can help to manage symptoms and prevent more health problems from developing. Treatment varies depending on:  Your symptoms.  Whether you want to have a baby or whether you need birth control (contraception).  Treatment may include nutrition and lifestyle changes along with:  Progesterone hormone to start a menstrual  period.  Birth control pills to help you have regular menstrual periods.  Medicines to make you ovulate, if you want to get pregnant.  Medicine to reduce excessive hair growth.  Surgery, in severe cases. This may involve making small holes in one or both of your ovaries. This decreases the amount of testosterone that your body produces.  Follow these instructions at home:  Take over-the-counter and prescription medicines only as told by your health care provider.  Follow a healthy meal plan. This can help you reduce the effects of PCOS. ? Eat a healthy diet that includes lean proteins, complex carbohydrates, fresh fruits and vegetables, low-fat dairy products, and healthy fats. Make sure to eat enough fiber.  If you are overweight, lose weight as told by your health care provider. ? Losing 10% of your body weight may improve symptoms. ? Your health care provider can determine how much weight loss is best for you and can help you lose weight safely.  Keep all follow-up visits as told by your health care provider. This is important. Contact a health care provider if:  Your symptoms do not get better with medicine.  You develop new symptoms. This information is not intended to replace advice  given to you by your health care provider. Make sure you discuss any questions you have with your health care provider. Document Released: 01/29/2005 Document Revised: 06/02/2016 Document Reviewed: 03/22/2016 Elsevier Interactive Patient Education  2018 [employer]. Diet for Polycystic Ovarian Syndrome Polycystic ovary syndrome (PCOS) is a disorder of the chemical messengers (hormones) that regulate menstruation. The condition causes important hormones to be out of balance. PCOS can:  Make your periods irregular or stop.  Cause cysts to develop on the ovaries.  Make it difficult to get pregnant.  Stop your body from responding to the effects of insulin (insulin resistance), which can lead to  obesity and diabetes.  Changing what you eat can help manage PCOS and improve your health. It can help you lose weight and improve the way your body uses insulin. What is my plan?  Eat breakfast, lunch, and dinner plus two snacks every day.  Include protein in each meal and snack.  Choose whole grains instead of products made with refined flour.  Eat a variety of foods.  Exercise regularly as told by your health care provider. What do I need to know about this eating plan? If you are overweight or obese, pay attention to how many calories you eat. Cutting down on calories can help you lose weight. Work with your health care provider or dietitian to figure out how many calories you need each day. What foods can I eat? Grains Whole grains, such as whole wheat. Whole-grain breads, crackers, cereals, and pasta. Unsweetened oatmeal, bulgur, barley, quinoa, or brown rice. Corn or whole-wheat flour tortillas. Vegetables  Lettuce. Spinach. Peas. Beets. Cauliflower. Cabbage. Broccoli. Carrots. Tomatoes. Squash. Eggplant. Herbs. Peppers. Onions. Cucumbers. Brussels sprouts. Fruits Berries. Bananas. Apples. Oranges. Grapes. Papaya. Mango. Pomegranate. Kiwi. Grapefruit. Cherries. Meats and Other Protein Sources Lean proteins, such as fish, chicken, beans, eggs, and tofu. Dairy Low-fat dairy products, such as skim milk, cheese sticks, and yogurt. Beverages Low-fat or fat-free drinks, such as water, low-fat milk, sugar-free drinks, and 100% fruit juice. Condiments Ketchup. Mustard. Barbecue sauce. Relish. Low-fat or fat-free mayonnaise. Fats and Oils Olive oil or canola oil. Walnuts and almonds. The items listed above may not be a complete list of recommended foods or beverages. Contact your dietitian for more options. What foods are not recommended? Foods high in calories or fat. Fried foods. Sweets. Products made from refined white flour, including white bread, pastries, white rice, and  pasta. The items listed above may not be a complete list of foods and beverages to avoid. Contact your dietitian for more information. This information is not intended to replace advice given to you by your health care provider. Make sure you discuss any questions you have with your health care provider. Document Released: 01/27/2016 Document Revised: 03/12/2016 Document Reviewed: 10/17/2014 Elsevier Interactive Patient Education  2018 [employer].

## 2018-06-10 ENCOUNTER — Other Ambulatory Visit: Payer: Self-pay | Admitting: Certified Nurse Midwife

## 2018-06-10 DIAGNOSIS — N926 Irregular menstruation, unspecified: Secondary | ICD-10-CM

## 2018-06-13 ENCOUNTER — Ambulatory Visit: Payer: Medicaid Other

## 2018-06-13 ENCOUNTER — Encounter: Payer: Self-pay | Admitting: Certified Nurse Midwife

## 2018-06-13 DIAGNOSIS — N926 Irregular menstruation, unspecified: Secondary | ICD-10-CM

## 2018-06-13 LAB — NUSWAB VAGINITIS (VG)
CANDIDA GLABRATA, NAA: NEGATIVE
Candida albicans, NAA: NEGATIVE
TRICH VAG BY NAA: NEGATIVE

## 2018-06-13 NOTE — Addendum Note (Signed)
Addended by: Brooke DareSICK, Guenther Dunshee L on: 06/13/2018 08:20 AM   Modules accepted: Orders

## 2018-06-14 ENCOUNTER — Encounter: Payer: Self-pay | Admitting: Certified Nurse Midwife

## 2018-06-14 LAB — COMPREHENSIVE METABOLIC PANEL
ALBUMIN: 3.9 g/dL (ref 3.5–5.5)
ALK PHOS: 45 IU/L (ref 39–117)
ALT: 17 IU/L (ref 0–32)
AST: 18 IU/L (ref 0–40)
Albumin/Globulin Ratio: 1.3 (ref 1.2–2.2)
BUN/Creatinine Ratio: 17 (ref 9–23)
BUN: 12 mg/dL (ref 6–20)
CHLORIDE: 105 mmol/L (ref 96–106)
CO2: 22 mmol/L (ref 20–29)
CREATININE: 0.71 mg/dL (ref 0.57–1.00)
Calcium: 9.1 mg/dL (ref 8.7–10.2)
GFR calc Af Amer: 143 mL/min/{1.73_m2} (ref >59)
GFR calc non Af Amer: 124 mL/min/{1.73_m2} (ref >59)
GLUCOSE: 87 mg/dL (ref 65–99)
Globulin, Total: 2.9 g/dL (ref 1.5–4.5)
Potassium: 4.5 mmol/L (ref 3.5–5.2)
Sodium: 141 mmol/L (ref 134–144)
Total Protein: 6.8 g/dL (ref 6.0–8.5)

## 2018-06-14 LAB — PROGESTERONE: PROGESTERONE: 0.6 ng/mL

## 2018-06-14 LAB — HEMOGLOBIN A1C
ESTIMATED AVERAGE GLUCOSE: 111 mg/dL
HEMOGLOBIN A1C: 5.5 % (ref 4.8–5.6)

## 2018-06-14 LAB — LIPID PANEL
CHOL/HDL RATIO: 3.9 ratio (ref 0.0–4.4)
Cholesterol, Total: 179 mg/dL — ABNORMAL HIGH (ref 100–169)
HDL: 46 mg/dL (ref >39)
LDL Calculated: 122 mg/dL — ABNORMAL HIGH (ref 0–109)
Triglycerides: 56 mg/dL (ref 0–89)
VLDL Cholesterol Cal: 11 mg/dL (ref 5–40)

## 2018-06-14 LAB — TSH: TSH: 1.84 u[IU]/mL (ref 0.450–4.500)

## 2018-06-14 LAB — FSH/LH
FSH: 6.5 m[IU]/mL
LH: 7 m[IU]/mL

## 2018-06-14 LAB — PROLACTIN: PROLACTIN: 17.9 ng/mL (ref 4.8–23.3)

## 2018-06-14 LAB — TESTOSTERONE, FREE, TOTAL, SHBG
SEX HORMONE BINDING: 199.3 nmol/L — AB (ref 24.6–122.0)
TESTOSTERONE FREE: 0.9 pg/mL
Testosterone: 31 ng/dL

## 2018-06-14 LAB — DHEA-SULFATE: DHEA SO4: 250.5 ug/dL (ref 110.0–433.2)

## 2018-06-16 ENCOUNTER — Ambulatory Visit (INDEPENDENT_AMBULATORY_CARE_PROVIDER_SITE_OTHER): Payer: Medicaid Other

## 2018-06-16 ENCOUNTER — Ambulatory Visit (INDEPENDENT_AMBULATORY_CARE_PROVIDER_SITE_OTHER): Payer: Medicaid Other | Admitting: Certified Nurse Midwife

## 2018-06-16 ENCOUNTER — Encounter: Payer: Self-pay | Admitting: Certified Nurse Midwife

## 2018-06-16 VITALS — BP 118/62 | HR 74 | Ht 63.0 in | Wt 225.4 lb

## 2018-06-16 DIAGNOSIS — N926 Irregular menstruation, unspecified: Secondary | ICD-10-CM

## 2018-06-16 DIAGNOSIS — Z09 Encounter for follow-up examination after completed treatment for conditions other than malignant neoplasm: Secondary | ICD-10-CM | POA: Diagnosis not present

## 2018-06-16 DIAGNOSIS — J45909 Unspecified asthma, uncomplicated: Secondary | ICD-10-CM | POA: Insufficient documentation

## 2018-06-16 NOTE — Patient Instructions (Signed)
Preparing for Pregnancy If you are considering becoming pregnant, make an appointment to see your regular health care provider to learn how to prepare for a safe and healthy pregnancy (preconception care). During a preconception care visit, your health care provider will:  Do a complete physical exam, including a Pap test.  Take a complete medical history.  Give you information, answer your questions, and help you resolve problems.  Preconception checklist Medical history  Tell your health care provider about any current or past medical conditions. Your pregnancy or your ability to become pregnant may be affected by chronic conditions, such as diabetes, chronic hypertension, and thyroid problems.  Include your family's medical history as well as your partner's medical history.  Tell your health care provider about any history of STIs (sexually transmitted infections).These can affect your pregnancy. In some cases, they can be passed to your baby. Discuss any concerns that you have about STIs.  If indicated, discuss the benefits of genetic testing. This testing will show whether there are any genetic conditions that may be passed from you or your partner to your baby.  Tell your health care provider about: ? Any problems you have had with conception or pregnancy. ? Any medicines you take. These include vitamins, herbal supplements, and over-the-counter medicines. ? Your history of immunizations. Discuss any vaccinations that you may need.  Diet  Ask your health care provider what to include in a healthy diet that has a balance of nutrients. This is especially important when you are pregnant or preparing to become pregnant.  Ask your health care provider to help you reach a healthy weight before pregnancy. ? If you are overweight, you may be at higher risk for certain complications, such as high blood pressure, diabetes, and preterm birth. ? If you are underweight, you are more likely  to have a baby who has a low birth weight.  Lifestyle, work, and home  Let your health care provider know: ? About any lifestyle habits that you have, such as alcohol use, drug use, or smoking. ? About recreational activities that may put you at risk during pregnancy, such as downhill skiing and certain exercise programs. ? Tell your health care provider about any international travel, especially any travel to places with an active [place] virus outbreak. ? About harmful substances that you may be exposed to at work or at home. These include chemicals, pesticides, radiation, or even litter boxes. ? If you do not feel safe at home.  Mental health  Tell your health care provider about: ? Any history of mental health conditions, including feelings of depression, sadness, or anxiety. ? Any medicines that you take for a mental health condition. These include herbs and supplements.  Home instructions to prepare for pregnancy Lifestyle  Eat a balanced diet. This includes fresh fruits and vegetables, whole grains, lean meats, low-fat dairy products, healthy fats, and foods that are high in fiber. Ask to meet with a nutritionist or registered dietitian for assistance with meal planning and goals.  Get regular exercise. Try to be active for at least 30 minutes a day on most days of the week. Ask your health care provider which activities are safe during pregnancy.  Do not use any products that contain nicotine or tobacco, such as cigarettes and e-cigarettes. If you need help quitting, ask your health care provider.  Do not drink alcohol.  Do not take illegal drugs.  Maintain a healthy weight. Ask your health care provider what weight range is  right for you.  General instructions  Keep an accurate record of your menstrual periods. This makes it easier for your health care provider to determine your baby's due date.  Begin taking prenatal vitamins and folic acid supplements daily as directed by  your health care provider.  Manage any chronic conditions, such as high blood pressure and diabetes, as told by your health care provider. This is important.  How do I know that I am pregnant? You may be pregnant if you have been sexually active and you miss your period. Symptoms of early pregnancy include:  Mild cramping.  Very light vaginal bleeding (spotting).  Feeling unusually tired.  Nausea and vomiting (morning sickness).  If you have any of these symptoms and you suspect that you might be pregnant, you can take a home pregnancy test. These tests check for a hormone in your urine (human chorionic gonadotropin, or hCG). A woman's body begins to make this hormone during early pregnancy. These tests are very accurate. Wait until at least the first day after you miss your period to take one. If the test shows that you are pregnant (you get a positive result), call your health care provider to make an appointment for prenatal care. What should I do if I become pregnant?  Make an appointment with your health care provider as soon as you suspect you are pregnant.  Do not use any products that contain nicotine, such as cigarettes, chewing tobacco, and e-cigarettes. If you need help quitting, ask your health care provider.  Do not drink alcoholic beverages. Alcohol is related to a number of birth defects.  Avoid toxic odors and chemicals.  You may continue to have sexual intercourse if it does not cause pain or other problems, such as vaginal bleeding. This information is not intended to replace advice given to you by your health care provider. Make sure you discuss any questions you have with your health care provider. Document Released: 09/17/2008 Document Revised: 06/02/2016 Document Reviewed: 04/26/2016 Elsevier Interactive Patient Education  2018 [employer]. Dysfunctional Uterine Bleeding Dysfunctional uterine bleeding is abnormal bleeding from the uterus. Dysfunctional uterine  bleeding includes:  A period that comes earlier or later than usual.  A period that is lighter, heavier, or has blood clots.  Bleeding between periods.  Skipping one or more periods.  Bleeding after sexual intercourse.  Bleeding after menopause.  Follow these instructions at home: Pay attention to any changes in your symptoms. Follow these instructions to help with your condition: Eating and drinking  Eat well-balanced meals. Include foods that are high in iron, such as liver, meat, shellfish, green leafy vegetables, and eggs.  If you become constipated: ? Drink plenty of water. ? Eat fruits and vegetables that are high in water and fiber, such as spinach, carrots, raspberries, apples, and mango. Medicines  Take over-the-counter and prescription medicines only as told by your health care provider.  Do not change medicines without talking with your health care provider.  Aspirin or medicines that contain aspirin may make the bleeding worse. Do not take those medicines: ? During the week before your period. ? During your period.  If you were prescribed iron pills, take them as told by your health care provider. Iron pills help to replace iron that your body loses because of this condition. Activity  If you need to change your sanitary pad or tampon more than one time every 2 hours: ? Lie in bed with your feet raised (elevated). ? Place a cold  pack on your lower abdomen. ? Rest as much as possible until the bleeding stops or slows down.  Do not try to lose weight until the bleeding has stopped and your blood iron level is back to normal. Other Instructions  For two months, write down: ? When your period starts. ? When your period ends. ? When any abnormal bleeding occurs. ? What problems you notice.  Keep all follow up visits as told by your health care provider. This is important. Contact a health care provider if:  You get light-headed or weak.  You have nausea  and vomiting.  You cannot eat or drink without vomiting.  You feel dizzy or have diarrhea while you are taking medicines.  You are taking birth control pills or hormones, and you want to change them or stop taking them. Get help right away if:  You develop a fever or chills.  You need to change your sanitary pad or tampon more than one time per hour.  Your bleeding becomes heavier, or your flow contains clots more often.  You develop pain in your abdomen.  You lose consciousness.  You develop a rash. This information is not intended to replace advice given to you by your health care provider. Make sure you discuss any questions you have with your health care provider. Document Released: 10/02/2000 Document Revised: 03/12/2016 Document Reviewed: 12/31/2014 Elsevier Interactive Patient Education  2018 [employer]; Ethinyl Estradiol tablets What is this medicine? DROSPIRENONE; ETHINYL ESTRADIOL (dro SPY re nown; ETH in il es tra DYE ole) is an oral contraceptive (birth control pill). This medicine combines two types of female hormones, an estrogen and a progestin. It is used to prevent ovulation and pregnancy. This medicine may be used for other purposes; ask your health care provider or pharmacist if you have questions. COMMON BRAND NAME(S): [place]is Smoker What should I tell my health care provider before I take this medicine? They need to know if you have or ever had any of these conditions: -abnormal vaginal bleeding -adrenal gland disease -blood vessel disease or blood clots -breast, cervical, endometrial, ovarian, liver, or uterine cancer -diabetes -gallbladder disease -heart disease or recent heart attack -high blood pressure -high cholesterol -high potassium level -kidney disease -liver disease -migraine headaches -stroke -systemic lupus erythematosus (SLE) -tobacco smoker -an unusual or  allergic reaction to estrogens, progestins, or other medicines, foods, dyes, or preservatives -pregnant or trying to get pregnant -breast-feeding How should I use this medicine? Take this medicine by mouth. To reduce nausea, this medicine may be taken with food. Follow the directions on the prescription label. Take this medicine at the same time each day and in the order directed on the package. Do not take your medicine more often than directed. A patient package insert for the product will be given with each prescription and refill. Read this sheet carefully each time. The sheet may change frequently. Talk to your pediatrician regarding the use of this medicine in children. Special care may be needed. This medicine has been used in female children who have started having menstrual periods. Overdosage: If you think you have taken too much of this medicine contact a poison control center or emergency room at once. NOTE: This medicine is only for you. Do not share this medicine with others. What if I miss a dose? If you miss a dose, refer to the patient information sheet you received with your medicine for direction. If you miss  more than one pill, this medicine may not be as effective and you may need to use another form of birth control. What may interact with this medicine? Do not take this medicine with any of the following medications: -aminoglutethimide -amprenavir, fosamprenavir -atazanavir; cobicistat -anastrozole -bosentan -exemestane -letrozole -metyrapone -testolactone This medicine may also interact with the following medications: -acetaminophen -antiviral medicines for HIV or AIDS -aprepitant -barbiturates -certain antibiotics like rifampin, rifabutin, rifapentine, and possibly penicillins or tetracyclines -certain diuretics like amiloride, spironolactone, triamterene -certain medicines for fungal infections like griseofulvin, ketoconazole, itraconazole -certain medications  for high blood pressure or heart conditions like ACE-inhibitors, Angiotensin-II receptor blockers, eplerenone -certain medicines for seizures like carbamazepine, oxcarbazepine, phenobarbital, phenytoin -cholestyramine -cobicistat -corticosteroid like hydrocortisone and prednisolone -cyclosporine -dantrolene -felbamate -grapefruit juice -heparin -lamotrigine -medicines for diabetes, including pioglitazone -modafinil -NSAIDs -potassium supplements -pyrimethamine -raloxifene -St. John's wort -sulfasalazine -tamoxifen -topiramate -thyroid hormones -warfarin his list may not describe all possible interactions. Give your health care provider a list of all the medicines, herbs, non-prescription drugs, or dietary supplements you use. Also tell them if you smoke, drink alcohol, or use illegal drugs. Some items may interact with your medicine. This list may not describe all possible interactions. Give your health care provider a list of all the medicines, herbs, non-prescription drugs, or dietary supplements you use. Also tell them if you smoke, drink alcohol, or use illegal drugs. Some items may interact with your medicine. What should I watch for while using this medicine? Visit your doctor or health care professional for regular checks on your progress. You will need a regular breast and pelvic exam and Pap smear while on this medicine. Use an additional method of contraception during the first cycle that you take these tablets. If you have any reason to think you are pregnant, stop taking this medicine right away and contact your doctor or health care professional. If you are taking this medicine for hormone related problems, it may take several cycles of use to see improvement in your condition. Smoking increases the risk of getting a blood clot or having a stroke while you are taking birth control pills, especially if you are more than 20 years old. You are strongly advised not to  smoke. This medicine can make your body retain fluid, making your fingers, hands, or ankles swell. Your blood pressure can go up. Contact your doctor or health care professional if you feel you are retaining fluid. This medicine can make you more sensitive to the sun. Keep out of the sun. If you cannot avoid being in the sun, wear protective clothing and use sunscreen. Do not use sun lamps or tanning beds/booths. If you wear contact lenses and notice visual changes, or if the lenses begin to feel uncomfortable, consult your eye care specialist. In some women, tenderness, swelling, or minor bleeding of the gums may occur. Notify your dentist if this happens. Brushing and flossing your teeth regularly may help limit this. See your dentist regularly and inform your dentist of the medicines you are taking. If you are going to have elective surgery, you may need to stop taking this medicine before the surgery. Consult your health care professional for advice. This medicine does not protect you against HIV infection (AIDS) or any other sexually transmitted diseases. What side effects may I notice from receiving this medicine? Side effects that you should report to your doctor or health care professional as soon as possible: -allergic reactions like skin rash, itching or hives, swelling of the face,  lips, or tongue -breast tissue changes or discharge -changes in vision -chest pain -confusion, trouble speaking or understanding -dark urine -general ill feeling or flu-like symptoms -light-colored stools -nausea, vomiting -pain, swelling, warmth in the leg -right upper belly pain -severe headaches -shortness of breath -sudden numbness or weakness of the face, arm or leg -trouble walking, dizziness, loss of balance or coordination -unusual vaginal bleeding -yellowing of the eyes or skin Side effects that usually do not require medical attention (report to your doctor or health care professional if they  continue or are bothersome): -acne -brown spots on the face -change in appetite -change in sexual desire -depressed mood or mood swings -fluid retention and swelling -stomach cramps or bloating -unusually weak or tired -weight gain This list may not describe all possible side effects. Call your doctor for medical advice about side effects. You may report side effects to FDA at 1-800-FDA-1088. Where should I keep my medicine? Keep out of the reach of children. Store at room temperature between 15 and 30 degrees C (59 and 86 degrees F). Throw away any unused medicine after the expiration date. NOTE: This sheet is a summary. It may not cover all possible information. If you have questions about this medicine, talk to your doctor, pharmacist, or health care provider.  2018 Elsevier/Gold Standard (2016-06-26 13:52:56) Preventive Care 18-39 Years, Female Preventive care refers to lifestyle choices and visits with your health care provider that can promote health and wellness. What does preventive care include?  A yearly physical exam. This is also called an annual well check.  Dental exams once or twice a year.  Routine eye exams. Ask your health care provider how often you should have your eyes checked.  Personal lifestyle choices, including: ? Daily care of your teeth and gums. ? Regular physical activity. ? Eating a healthy diet. ? Avoiding tobacco and drug use. ? Limiting alcohol use. ? Practicing safe sex. ? Taking vitamin and mineral supplements as recommended by your health care provider. What happens during an annual well check? The services and screenings done by your health care provider during your annual well check will depend on your age, overall health, lifestyle risk factors, and family history of disease. Counseling Your health care provider may ask you questions about your:  Alcohol use.  Tobacco use.  Drug use.  Emotional well-being.  Home and relationship  well-being.  Sexual activity.  Eating habits.  Work and work Statistician.  Method of birth control.  Menstrual cycle.  Pregnancy history.  Screening You may have the following tests or measurements:  Height, weight, and BMI.  Diabetes screening. This is done by checking your blood sugar (glucose) after you have not eaten for a while (fasting).  Blood pressure.  Lipid and cholesterol levels. These may be checked every 5 years starting at age 27.  Skin check.  Hepatitis C blood test.  Hepatitis B blood test.  Sexually transmitted disease (STD) testing.  BRCA-related cancer screening. This may be done if you have a family history of breast, ovarian, tubal, or peritoneal cancers.  Pelvic exam and Pap test. This may be done every 3 years starting at age 4. Starting at age 43, this may be done every 5 years if you have a Pap test in combination with an HPV test.  Discuss your test results, treatment options, and if necessary, the need for more tests with your health care provider. Vaccines Your health care provider may recommend certain vaccines, such as:  Influenza vaccine.  This is recommended every year.  Tetanus, diphtheria, and acellular pertussis (Tdap, Td) vaccine. You may need a Td booster every 10 years.  Varicella vaccine. You may need this if you have not been vaccinated.  HPV vaccine. If you are 4 or younger, you may need three doses over 6 months.  Measles, mumps, and rubella (MMR) vaccine. You may need at least one dose of MMR. You may also need a second dose.  Pneumococcal 13-valent conjugate (PCV13) vaccine. You may need this if you have certain conditions and were not previously vaccinated.  Pneumococcal polysaccharide (PPSV23) vaccine. You may need one or two doses if you smoke cigarettes or if you have certain conditions.  Meningococcal vaccine. One dose is recommended if you are age 60-21 years and a first-year college student living in a  residence hall, or if you have one of several medical conditions. You may also need additional booster doses.  Hepatitis A vaccine. You may need this if you have certain conditions or if you travel or work in places where you may be exposed to hepatitis A.  Hepatitis B vaccine. You may need this if you have certain conditions or if you travel or work in places where you may be exposed to hepatitis B.  Haemophilus influenzae type b (Hib) vaccine. You may need this if you have certain risk factors.  Talk to your health care provider about which screenings and vaccines you need and how often you need them. This information is not intended to replace advice given to you by your health care provider. Make sure you discuss any questions you have with your health care provider. Document Released: 12/01/2001 Document Revised: 06/24/2016 Document Reviewed: 08/06/2015 Elsevier Interactive Patient Education  [employer].

## 2018-06-16 NOTE — Progress Notes (Signed)
Pt is here for a followup to U/S done a few minutes ago.

## 2018-06-16 NOTE — Progress Notes (Signed)
GYN ENCOUNTER NOTE  Subjective:       Gabrielle Byrd is a 22 y.o. G0P0000 female here for results review. LOV: 06/09/2018 for vaginal discharge and odor, irregular menses, and fertility questions.   Taking Yaz. Denies difficulty breathing or respiratory distress, chest pain, abdominal pain, excessive vaginal bleeding, dysuria, and leg pain or swelling.    Gynecologic History  Patient's last menstrual period was 06/07/2018 (exact date).  Contraception: none  Last Pap: N/A.   Obstetric History  OB History  Gravida Para Term Preterm AB Living  0 0 0 0 0 0  SAB TAB Ectopic Multiple Live Births  0 0 0 0 0    Past Medical History:  Diagnosis Date  . Asthma     Past Surgical History:  Procedure Laterality Date  . NO PAST SURGERIES      Current Outpatient Medications on File Prior to Visit  Medication Sig Dispense Refill  . amoxicillin (AMOXIL) 875 MG tablet Take 875 mg by mouth 2 (two) times daily.  0  . Cholecalciferol (VITAMIN D3) 50000 units CAPS TAKE ONE CAPSULE BY MOUTH ONE TIME PER WEEK  3  . drospirenone-ethinyl estradiol (YAZ) 3-0.02 MG tablet Take 1 tablet by mouth daily. 3 Package 4  . fluticasone (FLONASE) 50 MCG/ACT nasal spray Place 2 sprays into both nostrils daily.  5  . ibuprofen (ADVIL,MOTRIN) 800 MG tablet Take 800 mg by mouth 3 (three) times daily as needed.  0  . PROAIR HFA 108 (90 Base) MCG/ACT inhaler USE 2 INHALATIONS EVERY FOUR TO SIX HOURS AS NEEDED  5   No current facility-administered medications on file prior to visit.     No Known Allergies  Social History   Socioeconomic History  . Marital status: Single    Spouse name: Not on file  . Number of children: Not on file  . Years of education: Not on file  . Highest education level: Not on file  Occupational History  . Not on file  Social Needs  . Financial resource strain: Not on file  . Food insecurity:    Worry: Not on file    Inability: Not on file  . Transportation needs:     Medical: Not on file    Non-medical: Not on file  Tobacco Use  . Smoking status: Never Smoker  . Smokeless tobacco: Never Used  Substance and Sexual Activity  . Alcohol use: No  . Drug use: No  . Sexual activity: Yes    Birth control/protection: None  Lifestyle  . Physical activity:    Days per week: Not on file    Minutes per session: Not on file  . Stress: Not on file  Relationships  . Social connections:    Talks on phone: Not on file    Gets together: Not on file    Attends religious service: Not on file    Active member of club or organization: Not on file    Attends meetings of clubs or organizations: Not on file    Relationship status: Not on file  . Intimate partner violence:    Fear of current or ex partner: Not on file    Emotionally abused: Not on file    Physically abused: Not on file    Forced sexual activity: Not on file  Other Topics Concern  . Not on file  Social History Narrative  . Not on file    Family History  Problem Relation Age of Onset  . Asthma Mother   .  Rheum arthritis Mother   . Hypertension Mother   . Heart failure Father   . Diabetes Maternal Grandmother   . Rheum arthritis Maternal Grandmother   . Heart failure Maternal Grandfather   . Cancer Maternal Grandfather   . Rheum arthritis Maternal Grandfather     The following portions of the patient's history were reviewed and updated as appropriate: allergies, current medications, past family history, past medical history, past social history, past surgical history and problem list.  Review of Systems  ROS negative except as negative. Information obtained from patient.   Objective:   BP 118/62   Pulse 74   Ht 5\' 3"  (1.6 m)   Wt 225 lb 7 oz (102.3 kg)   LMP 06/07/2018 (Exact Date)   BMI 39.93 kg/m   GENERAL: Alert and oriented x 4, no apparent distress.   ULTRASOUND REPORT  Location: [facility] Date of Service:  06/16/2018  Indications: Irregular  menses Findings:  The uterus measures 8.7 x 4.2 x 4.0 cm. Echo texture is homogeneous without evidence of focal masses.  The Endometrium measures 6.8 mm.  Right Ovary measures 3.0 x 1.6 x 2.1 cm. It is normal in appearance. Left Ovary measures 3.2 x 1.9 x 1.5 cm. It is normal appearance. Survey of the adnexa demonstrates no adnexal masses. There is no free fluid in the cul de sac.  Impression: 1. Anteverted uterus appears of normal size and contour. 2. Endometrium measures 6.8 mm. 3. Bilateral ovaries appear WNL.  Recommendations: 1.Clinical correlation with the patient's History and Physical Exam.   NuSwab Vaginitis (VG)     Status: None   Collection Time: 06/10/18  1:03 PM  Result Value Ref Range   Atopobium vaginae Low - 0 Score   BVAB 2 Low - 0 Score   Megasphaera 1 Low - 0 Score    Comment: Calculate total score by adding the 3 individual bacterial vaginosis (BV) marker scores together.  Total score is interpreted as follows: Total score 0-1: Indicates the absence of BV. Total score   2: Indeterminate for BV. Additional clinical                  data should be evaluated to establish a                  diagnosis. Total score 3-6: Indicates the presence of BV. This test was developed and its performance characteristics determined by [facility].  It has not been cleared or approved by the Food and Drug Administration.  The FDA has determined that such clearance or approval is not necessary.    Candida albicans, NAA Negative Negative   Candida glabrata, NAA Negative Negative    Comment: This test was developed and its performance characteristics determined by [facility].  It has not been cleared or approved by the Food and Drug Administration.  The FDA has determined that such clearance or approval is not necessary.    Trich vag by NAA Negative Negative  Comprehensive metabolic panel     Status: None   Collection Time: 06/13/18  8:22 AM  Result Value Ref Range   Glucose  87 65 - 99 mg/dL   BUN 12 6 - 20 mg/dL   Creatinine, Ser 7.82 0.57 - 1.00 mg/dL   GFR calc non Af Amer 124 >59 mL/min/1.73   GFR calc Af Amer 143 >59 mL/min/1.73   BUN/Creatinine Ratio 17 9 - 23   Sodium 141 134 - 144 mmol/L   Potassium  4.5 3.5 - 5.2 mmol/L   Chloride 105 96 - 106 mmol/L   CO2 22 20 - 29 mmol/L   Calcium 9.1 8.7 - 10.2 mg/dL   Total Protein 6.8 6.0 - 8.5 g/dL   Albumin 3.9 3.5 - 5.5 g/dL   Globulin, Total 2.9 1.5 - 4.5 g/dL   Albumin/Globulin Ratio 1.3 1.2 - 2.2   Bilirubin Total <0.2 0.0 - 1.2 mg/dL   Alkaline Phosphatase 45 39 - 117 IU/L   AST 18 0 - 40 IU/L   ALT 17 0 - 32 IU/L  DHEA-sulfate     Status: None   Collection Time: 06/13/18  8:22 AM  Result Value Ref Range   DHEA-SO4 250.5 110.0 - 433.2 ug/dL  FSH/LH     Status: None   Collection Time: 06/13/18  8:22 AM  Result Value Ref Range   LH 7.0 mIU/mL    Comment:                     Adult Female:                       Follicular phase      2.4 -  12.6                       Ovulation phase      14.0 -  95.6                       Luteal phase          1.0 -  11.4                       Postmenopausal        7.7 -  58.5    FSH 6.5 mIU/mL    Comment:                     Adult Female:                       Follicular phase      3.5 -  12.5                       Ovulation phase       4.7 -  21.5                       Luteal phase          1.7 -   7.7                       Postmenopausal       25.8 - 134.8   Hemoglobin A1c     Status: None   Collection Time: 06/13/18  8:22 AM  Result Value Ref Range   Hgb A1c MFr Bld 5.5 4.8 - 5.6 %    Comment:          Prediabetes: 5.7 - 6.4          Diabetes: >6.4          Glycemic control for adults with diabetes: <7.0    Est. average glucose Bld gHb Est-mCnc 111 mg/dL  Lipid panel     Status: Abnormal   Collection Time: 06/13/18  8:22 AM  Result Value Ref Range   Cholesterol, Total 179 (  H) 100 - 169 mg/dL   Triglycerides 56 0 - 89 mg/dL   HDL 46 >16 mg/dL    VLDL Cholesterol Cal 11 5 - 40 mg/dL   LDL Calculated 109 (H) 0 - 109 mg/dL   Chol/HDL Ratio 3.9 0.0 - 4.4 ratio    Comment:                                   T. Chol/HDL Ratio                                             Men  Women                               1/2 Avg.Risk  3.4    3.3                                   Avg.Risk  5.0    4.4                                2X Avg.Risk  9.6    7.1                                3X Avg.Risk 23.4   11.0   Progesterone     Status: None   Collection Time: 06/13/18  8:22 AM  Result Value Ref Range   Progesterone 0.6 ng/mL    Comment:                      Follicular phase       0.1 -   0.9                      Luteal phase           1.8 -  23.9                      Ovulation phase        0.1 -  12.0                      Pregnant                         First trimester    11.0 -  44.3                         Second trimester   25.4 -  83.3                         Third trimester    58.7 - 214.0                      Postmenopausal         0.0 -   0.1   Prolactin     Status: None   Collection Time: 06/13/18  8:22 AM  Result Value Ref Range   Prolactin 17.9 4.8 - 23.3 ng/mL  Testosterone, Free, Total, SHBG     Status: Abnormal   Collection Time: 06/13/18  8:22 AM  Result Value Ref Range   Testosterone 31 ng/dL    Comment:                                   FEMALE TANNER STAGE                                  1           <3 -   6                                  2           <3 -  10                                  3           <3 -  24                                  4           <3 -  27                                  5            5  -  38    Testosterone, Free 0.9 Not Estab. pg/mL   Sex Hormone Binding 199.3 (H) 24.6 - 122.0 nmol/L  TSH     Status: None   Collection Time: 06/13/18  8:22 AM  Result Value Ref Range   TSH 1.840 0.450 - 4.500 uIU/mL    Assessment:   1. Irregular menses   2. Follow up   Plan:   Lab results with  patient, verbalized understanding.   Would like to continue Yaz and follow up with [facility] Nutrition Therapy for weight loss management.   Reviewed red flag symptoms and when to call.   RTC x 1 year for Annual Exam or sooner if needed.    Gunnar BullaJenkins Michelle Alizae Bechtel, CNM [facility], [facility]

## 2018-07-27 ENCOUNTER — Ambulatory Visit: Payer: Self-pay | Admitting: Dietician

## 2018-08-01 ENCOUNTER — Encounter: Payer: Self-pay | Admitting: Certified Nurse Midwife

## 2018-08-24 ENCOUNTER — Encounter: Payer: Self-pay | Admitting: Certified Nurse Midwife

## 2018-08-25 ENCOUNTER — Ambulatory Visit (INDEPENDENT_AMBULATORY_CARE_PROVIDER_SITE_OTHER): Payer: Medicaid Other | Admitting: Certified Nurse Midwife

## 2018-08-25 ENCOUNTER — Encounter: Payer: Self-pay | Admitting: Certified Nurse Midwife

## 2018-08-25 ENCOUNTER — Encounter: Payer: Medicaid Other | Admitting: Certified Nurse Midwife

## 2018-08-25 VITALS — BP 106/79 | HR 71 | Ht 63.0 in | Wt 221.8 lb

## 2018-08-25 DIAGNOSIS — R198 Other specified symptoms and signs involving the digestive system and abdomen: Secondary | ICD-10-CM

## 2018-08-25 DIAGNOSIS — R1011 Right upper quadrant pain: Secondary | ICD-10-CM

## 2018-08-25 DIAGNOSIS — Z6839 Body mass index (BMI) 39.0-39.9, adult: Secondary | ICD-10-CM

## 2018-08-25 DIAGNOSIS — E669 Obesity, unspecified: Secondary | ICD-10-CM

## 2018-08-25 NOTE — Progress Notes (Signed)
ENCOUNTER NOTE  Subjective:       Gabrielle Byrd is a 22 y.o. G0P0000 female for evaluation of right upper quadrant pain x two (2) days.   Pain started before work and made patient want to return to bed. Has not attempted any home treatment measures like medications or ice/heat.   At first pain was constant, but now patient reports light sharp pain and "bloating" feeling.   Denies difficulty breathing or respiratory distress, fever, chest pain, vaginal bleeding or discharge, dysuria, and leg pain or swelling.     Gynecologic History  Patient's last menstrual period was 08/08/2018 (exact date). Period Cycle (Days): 28 Period Duration (Days): 7 Period Pattern: Regular Menstrual Flow: Heavy Menstrual Control: Maxi pad Dysmenorrhea: (!) Moderate Dysmenorrhea Symptoms: Cramping  Contraception: OCP (estrogen/progesterone)  Last Pap: N/A.   Obstetric History  OB History  Gravida Para Term Preterm AB Living  0 0 0 0 0 0  SAB TAB Ectopic Multiple Live Births  0 0 0 0 0    Past Medical History:  Diagnosis Date  . Asthma     Past Surgical History:  Procedure Laterality Date  . NO PAST SURGERIES      Current Outpatient Medications on File Prior to Visit  Medication Sig Dispense Refill  . Cholecalciferol (VITAMIN D3) 50000 units CAPS TAKE ONE CAPSULE BY MOUTH ONE TIME PER WEEK  3  . drospirenone-ethinyl estradiol (YAZ) 3-0.02 MG tablet Take 1 tablet by mouth daily. 3 Package 4  . fluticasone (FLONASE) 50 MCG/ACT nasal spray Place 2 sprays into both nostrils daily.  5  . ibuprofen (ADVIL,MOTRIN) 800 MG tablet Take 800 mg by mouth 3 (three) times daily as needed.  0  . PROAIR HFA 108 (90 Base) MCG/ACT inhaler USE 2 INHALATIONS EVERY FOUR TO SIX HOURS AS NEEDED  5   No current facility-administered medications on file prior to visit.     No Known Allergies  Social History   Socioeconomic History  . Marital status: Single    Spouse name: Not on file  . Number of  children: Not on file  . Years of education: Not on file  . Highest education level: Not on file  Occupational History  . Not on file  Social Needs  . Financial resource strain: Not on file  . Food insecurity:    Worry: Not on file    Inability: Not on file  . Transportation needs:    Medical: Not on file    Non-medical: Not on file  Tobacco Use  . Smoking status: Never Smoker  . Smokeless tobacco: Never Used  Substance and Sexual Activity  . Alcohol use: No  . Drug use: No  . Sexual activity: Yes    Birth control/protection: Pill  Lifestyle  . Physical activity:    Days per week: Not on file    Minutes per session: Not on file  . Stress: Not on file  Relationships  . Social connections:    Talks on phone: Not on file    Gets together: Not on file    Attends religious service: Not on file    Active member of club or organization: Not on file    Attends meetings of clubs or organizations: Not on file    Relationship status: Not on file  . Intimate partner violence:    Fear of current or ex partner: Not on file    Emotionally abused: Not on file    Physically abused: Not on file  Forced sexual activity: Not on file  Other Topics Concern  . Not on file  Social History Narrative  . Not on file    Family History  Problem Relation Age of Onset  . Asthma Mother   . Rheum arthritis Mother   . Hypertension Mother   . Heart failure Father   . Diabetes Maternal Grandmother   . Rheum arthritis Maternal Grandmother   . Heart failure Maternal Grandfather   . Cancer Maternal Grandfather   . Rheum arthritis Maternal Grandfather   . Ovarian cancer Maternal Aunt   . Breast cancer Neg Hx   . Colon cancer Neg Hx     The following portions of the patient's history were reviewed and updated as appropriate: allergies, current medications, past family history, past medical history, past social history, past surgical history and problem list.  Review of Systems  ROS negative  except as noted above. Information obtained from patient.   Objective:   BP 106/79   Pulse 71   Ht 5\' 3"  (1.6 m)   Wt 221 lb 12.8 oz (100.6 kg)   LMP 08/08/2018 (Exact Date)   BMI 39.29 kg/m    CONSTITUTIONAL: Well-developed, well-nourished female in no acute distress.   ABDOMEN: Soft, non distended; Pressure/tenderness with touch. Positive Murphy's sign.   Assessment:   1. Right upper quadrant pain  - US ABDOMEN LIMITED RUQ; Future  2. Class 2 obesity without serious comorbidity with body mass index (BMI) of 39.0 to 39.9 in adult, unspecified obesity type  - US ABDOMEN LIMITED RUQ; Future  3. Right upper quadrant abdominal pain with positive Murphy's Sign  - US ABDOMEN LIMITED RUQ; Future   Plan:   Will scheduled gallbladder US, see orders.   Discussed home treatment measures including dietary changes.   Reviewed red flag symptoms and when to call.   RTC as needed.    Gunnar Bulla, CNM [facility], [facility]

## 2018-08-25 NOTE — Patient Instructions (Signed)
Abdominal Pain, Adult Many things can cause belly (abdominal) pain. Most times, belly pain is not dangerous. Many cases of belly pain can be watched and treated at home. Sometimes belly pain is serious, though. Your doctor will try to find the cause of your belly pain. Follow these instructions at home:  Take over-the-counter and prescription medicines only as told by your doctor. Do not take medicines that help you poop (laxatives) unless told to by your doctor.  Drink enough fluid to keep your pee (urine) clear or pale yellow.  Watch your belly pain for any changes.  Keep all follow-up visits as told by your doctor. This is important. Contact a doctor if:  Your belly pain changes or gets worse.  You are not hungry, or you lose weight without trying.  You are having trouble pooping (constipated) or have watery poop (diarrhea) for more than 2-3 days.  You have pain when you pee or poop.  Your belly pain wakes you up at night.  Your pain gets worse with meals, after eating, or with certain foods.  You are throwing up and cannot keep anything down.  You have a fever. Get help right away if:  Your pain does not go away as soon as your doctor says it should.  You cannot stop throwing up.  Your pain is only in areas of your belly, such as the right side or the left lower part of the belly.  You have bloody or black poop, or poop that looks like tar.  You have very bad pain, cramping, or bloating in your belly.  You have signs of not having enough fluid or water in your body (dehydration), such as: ? Dark pee, very little pee, or no pee. ? Cracked lips. ? Dry mouth. ? Sunken eyes. ? Sleepiness. ? Weakness. This information is not intended to replace advice given to you by your health care provider. Make sure you discuss any questions you have with your health care provider. Document Released: 03/23/2008 Document Revised: 04/24/2016 Document Reviewed: 03/18/2016 Elsevier  Interactive Patient Education  2018 [employer] Low-Fat Diet for Pancreatitis or Gallbladder Conditions A low-fat diet can be helpful if you have pancreatitis or a gallbladder condition. With these conditions, your pancreas and gallbladder have trouble digesting fats. A healthy eating plan with less fat will help rest your pancreas and gallbladder and reduce your symptoms. What do I need to know about this diet?  Eat a low-fat diet. ? Reduce your fat intake to less than 20-30% of your total daily calories. This is less than 50-60 g of fat per day. ? Remember that you need some fat in your diet. Ask your dietician what your daily goal should be. ? Choose nonfat and low-fat healthy foods. Look for the words "nonfat," "low fat," or "fat free." ? As a guide, look on the label and choose foods with less than 3 g of fat per serving. Eat only one serving.  Avoid alcohol.  Do not smoke. If you need help quitting, talk with your health care provider.  Eat small frequent meals instead of three large heavy meals. What foods can I eat? Grains Include healthy grains and starches such as potatoes, wheat bread, fiber-rich cereal, and brown rice. Choose whole grain options whenever possible. In adults, whole grains should account for 45-65% of your daily calories. Fruits and Vegetables Eat plenty of fruits and vegetables. Fresh fruits and vegetables add fiber to your diet. Meats and Other Protein Sources Eat lean  meat such as chicken and pork. Trim any fat off of meat before cooking it. Eggs, fish, and beans are other sources of protein. In adults, these foods should account for 10-35% of your daily calories. Dairy Choose low-fat milk and dairy options. Dairy includes fat and protein, as well as calcium. Fats and Oils Limit high-fat foods such as fried foods, sweets, baked goods, sugary drinks. Other Creamy sauces and condiments, such as mayonnaise, can add extra fat. Think about whether or not you  need to use them, or use smaller amounts or low fat options. What foods are not recommended?  High fat foods, such as: ? [employer]. ? Ice cream. ? [place] toast. ? Sweet rolls. ? Pizza. ? Cheese bread. ? Foods covered with batter, butter, creamy sauces, or cheese. ? Fried foods. ? Sugary drinks and desserts.  Foods that cause gas or bloating This information is not intended to replace advice given to you by your health care provider. Make sure you discuss any questions you have with your health care provider. Document Released: 10/10/2013 Document Revised: 03/12/2016 Document Reviewed: 09/18/2013 Elsevier Interactive Patient Education  2017 [employer].

## 2018-08-30 ENCOUNTER — Ambulatory Visit: Payer: Medicaid Other

## 2018-09-06 ENCOUNTER — Ambulatory Visit
Admission: RE | Admit: 2018-09-06 | Discharge: 2018-09-06 | Disposition: A | Discharge status: Home / Self Care | Payer: Medicaid Other | Source: Ambulatory Visit | Attending: Certified Nurse Midwife | Admitting: Certified Nurse Midwife

## 2018-09-06 DIAGNOSIS — R198 Other specified symptoms and signs involving the digestive system and abdomen: Secondary | ICD-10-CM | POA: Diagnosis not present

## 2018-09-06 DIAGNOSIS — E669 Obesity, unspecified: Secondary | ICD-10-CM | POA: Diagnosis present

## 2018-09-06 DIAGNOSIS — R1011 Right upper quadrant pain: Secondary | ICD-10-CM | POA: Insufficient documentation

## 2018-09-06 DIAGNOSIS — Z6839 Body mass index (BMI) 39.0-39.9, adult: Secondary | ICD-10-CM | POA: Diagnosis present

## 2019-03-24 ENCOUNTER — Ambulatory Visit: Payer: Self-pay | Admitting: Otolaryngology

## 2019-03-27 ENCOUNTER — Other Ambulatory Visit: Payer: Self-pay

## 2019-03-27 ENCOUNTER — Other Ambulatory Visit
Admission: RE | Admit: 2019-03-27 | Discharge: 2019-03-27 | Disposition: A | Discharge status: Home / Self Care | Payer: Medicaid Other | Source: Ambulatory Visit | Attending: Otolaryngology | Admitting: Otolaryngology

## 2019-03-27 ENCOUNTER — Encounter: Payer: Self-pay | Admitting: *Deleted

## 2019-03-27 DIAGNOSIS — Z1159 Encounter for screening for other viral diseases: Secondary | ICD-10-CM | POA: Insufficient documentation

## 2019-03-27 NOTE — Anesthesia Preprocedure Evaluation (Addendum)
Anesthesia Evaluation  Patient identified by MRN, date of birth, ID band Patient awake    Reviewed: Allergy & Precautions, NPO status , Patient's Chart, lab work & pertinent test results  History of Anesthesia Complications Negative for: history of anesthetic complications  Airway Mallampati: I   Neck ROM: Full    Dental no notable dental hx.    Pulmonary neg pulmonary ROS, asthma ,    Pulmonary exam normal breath sounds clear to auscultation       Cardiovascular Exercise Tolerance: Good negative cardio ROS Normal cardiovascular exam Rhythm:Regular Rate:Normal     Neuro/Psych negative neurological ROS     GI/Hepatic negative GI ROS,   Endo/Other  negative endocrine ROSObesity   Renal/GU negative Renal ROS     Musculoskeletal   Abdominal   Peds  Hematology  (+) Blood dyscrasia, anemia ,   Anesthesia Other Findings Chronic tonsillitis  Reproductive/Obstetrics                            Anesthesia Physical Anesthesia Plan  ASA: II  Anesthesia Plan: General   Post-op Pain Management:    Induction: Intravenous  PONV Risk Score and Plan: 3 and Dexamethasone and Ondansetron  Airway Management Planned: Oral ETT  Additional Equipment:   Intra-op Plan:   Post-operative Plan: Extubation in OR  Informed Consent: I have reviewed the patients History and Physical, chart, labs and discussed the procedure including the risks, benefits and alternatives for the proposed anesthesia with the patient or authorized representative who has indicated his/her understanding and acceptance.       Plan Discussed with: CRNA  Anesthesia Plan Comments:        Anesthesia Quick Evaluation

## 2019-03-28 LAB — NOVEL CORONAVIRUS, NAA (HOSP ORDER, SEND-OUT TO REF LAB; TAT 18-24 HRS): SARS-CoV-2, NAA: NOT DETECTED

## 2019-03-29 NOTE — Discharge Instructions (Signed)
T & A INSTRUCTION SHEET - [facility] °[facility] ° °CREIGHTON VAUGHT, MD °PAUL H. JUENGEL, MD  °P. SCOTT BENNETT °CHAPMAN MCQUEEN, MD ° °[address] [place], [place] [address] TEL. [phone] °[address] [place] [address] [phone] ° °INFORMATION SHEET FOR A TONSILLECTOMY AND ADENDOIDECTOMY ° °About Your Tonsils and Adenoids ° The tonsils and adenoids are normal body tissues that are part of our immune system.  They normally help to protect us against diseases that may enter our mouth and nose.  However, sometimes the tonsils and/or adenoids become too large and obstruct our breathing, especially at night. °  ° If either of these things happen it helps to remove the tonsils and adenoids in order to become healthier. The operation to remove the tonsils and adenoids is called a tonsillectomy and adenoidectomy. ° °The Location of Your Tonsils and Adenoids ° The tonsils are located in the back of the throat on both side and sit in a cradle of muscles. The adenoids are located in the roof of the mouth, behind the nose, and closely associated with the opening of the Eustachian tube to the ear. ° °Surgery on Tonsils and Adenoids ° A tonsillectomy and adenoidectomy is a short operation which takes about thirty minutes.  This includes being put to sleep and being awakened.  Tonsillectomies and adenoidectomies are performed at [facility] and may require observation period in the recovery room prior to going home. ° °Following the Operation for a Tonsillectomy ° A cautery machine is used to control bleeding.  Bleeding from a tonsillectomy and adenoidectomy is minimal and postoperatively the risk of bleeding is approximately four percent, although this rarely life threatening. ° ° ° °After your tonsillectomy and adenoidectomy post-op care at home: ° °1. Our patients are able to go home the same day.  You may be given prescriptions for pain  medications and antibiotics, if indicated. °2. It is extremely important to remember that fluid intake is of utmost importance after a tonsillectomy.  The amount that you drink must be maintained in the postoperative period.  A good indication of whether a child is getting enough fluid is whether his/her urine output is constant.  As long as children are urinating or wetting their diaper every 6 - 8 hours this is usually enough fluid intake.   °3. Although rare, this is a risk of some bleeding in the first ten days after surgery.  This is usually occurs between day five and nine postoperatively.  This risk of bleeding is approximately four percent.  If you or your child should have any bleeding you should remain calm and notify our office or go directly to the Emergency Room at [facility] where they will contact us. Our doctors are available seven days a week for notification.  We recommend sitting up quietly in a chair, place an ice pack on the front of the neck and spitting out the blood gently until we are able to contact you.  Adults should gargle gently with ice water and this may help stop the bleeding.  If the bleeding does not stop after a short time, i.e. 10 to 15 minutes, or seems to be increasing again, please contact us or go to the hospital.   °4. It is common for the pain to be worse at 5 - 7 days postoperatively.  This occurs because the “scab” is peeling off and the mucous membrane (skin of   the throat) is growing back where the tonsils were.   °5. It is common for a low-grade fever, less than 102, during the first week after a tonsillectomy and adenoidectomy.  It is usually due to not drinking enough liquids, and we suggest your use liquid Tylenol or the pain medicine with Tylenol prescribed in order to keep your temperature below 102.  Please follow the directions on the back of the bottle. °6. Do not take aspirin or any products that contain aspirin such as Bufferin, Anacin,  Ecotrin, aspirin gum, Goodies, BC headache powders, etc., after a T&A because it can promote bleeding.  Please check with our office before administering any other medication that may been prescribed by other doctors during the two week post-operative period. °7. If you happen to look in the mirror or into your child’s mouth you will see white/gray patches on the back of the throat.  This is what a scab looks like in the mouth and is normal after having a T&A.  It will disappear once the tonsil area heals completely. However, it may cause a noticeable odor, and this too will disappear with time.     °8. You or your child may experience ear pain after having a T&A.  This is called referred pain and comes from the throat, but it is felt in the ears.  Ear pain is quite common and expected.  It will usually go away after ten days.  There is usually nothing wrong with the ears, and it is primarily due to the healing area stimulating the nerve to the ear that runs along the side of the throat.  Use either the prescribed pain medicine or Tylenol as needed.  °9. The throat tissues after a tonsillectomy are obviously sensitive.  Smoking around children who have had a tonsillectomy significantly increases the risk of bleeding.  DO NOT SMOKE!  ° °General Anesthesia, Adult, Care After °This sheet gives you information about how to care for yourself after your procedure. Your health care provider may also give you more specific instructions. If you have problems or questions, contact your health care provider. °What can I expect after the procedure? °After the procedure, the following side effects are common: °· Pain or discomfort at the IV site. °· Nausea. °· Vomiting. °· Sore throat. °· Trouble concentrating. °· Feeling cold or chills. °· Weak or tired. °· Sleepiness and fatigue. °· Soreness and body aches. These side effects can affect parts of the body that were not involved in surgery. °Follow these instructions at  home: ° °For at least 24 hours after the procedure: °· Have a responsible adult stay with you. It is important to have someone help care for you until you are awake and alert. °· Rest as needed. °· Do not: °? Participate in activities in which you could fall or become injured. °? Drive. °? Use heavy machinery. °? Drink alcohol. °? Take sleeping pills or medicines that cause drowsiness. °? Make important decisions or sign legal documents. °? Take care of children on your own. °Eating and drinking °· Follow any instructions from your health care provider about eating or drinking restrictions. °· When you feel hungry, start by eating small amounts of foods that are soft and easy to digest (bland), such as toast. Gradually return to your regular diet. °· Drink enough fluid to keep your urine pale yellow. °· If you vomit, rehydrate by drinking water, juice, or clear broth. °General instructions °· If you have sleep apnea,   surgery and certain medicines can increase your risk for breathing problems. Follow instructions from your health care provider about wearing your sleep device: °? Anytime you are sleeping, including during daytime naps. °? While taking prescription pain medicines, sleeping medicines, or medicines that make you drowsy. °· Return to your normal activities as told by your health care provider. Ask your health care provider what activities are safe for you. °· Take over-the-counter and prescription medicines only as told by your health care provider. °· If you smoke, do not smoke without supervision. °· Keep all follow-up visits as told by your health care provider. This is important. °Contact a health care provider if: °· You have nausea or vomiting that does not get better with medicine. °· You cannot eat or drink without vomiting. °· You have pain that does not get better with medicine. °· You are unable to pass urine. °· You develop a skin rash. °· You have a fever. °· You have redness around your IV  site that gets worse. °Get help right away if: °· You have difficulty breathing. °· You have chest pain. °· You have blood in your urine or stool, or you vomit blood. °Summary °· After the procedure, it is common to have a sore throat or nausea. It is also common to feel tired. °· Have a responsible adult stay with you for the first 24 hours after general anesthesia. It is important to have someone help care for you until you are awake and alert. °· When you feel hungry, start by eating small amounts of foods that are soft and easy to digest (bland), such as toast. Gradually return to your regular diet. °· Drink enough fluid to keep your urine pale yellow. °· Return to your normal activities as told by your health care provider. Ask your health care provider what activities are safe for you. °This information is not intended to replace advice given to you by your health care provider. Make sure you discuss any questions you have with your health care provider. °Document Released: 01/11/2001 Document Revised: 05/21/2017 Document Reviewed: 05/21/2017 °Elsevier Interactive Patient Education © 2019 [employer] ° °

## 2019-03-30 ENCOUNTER — Ambulatory Visit
Admission: RE | Admit: 2019-03-30 | Discharge: 2019-03-30 | Disposition: A | Discharge status: Home / Self Care | Payer: Medicaid Other | Attending: Otolaryngology | Admitting: Otolaryngology

## 2019-03-30 ENCOUNTER — Ambulatory Visit: Payer: Medicaid Other | Admitting: Anesthesiology

## 2019-03-30 ENCOUNTER — Encounter
Admission: RE | Disposition: A | Discharge status: Home / Self Care | Payer: Self-pay | Source: Home / Self Care | Attending: Otolaryngology

## 2019-03-30 DIAGNOSIS — J3501 Chronic tonsillitis: Secondary | ICD-10-CM | POA: Diagnosis present

## 2019-03-30 DIAGNOSIS — J45909 Unspecified asthma, uncomplicated: Secondary | ICD-10-CM | POA: Insufficient documentation

## 2019-03-30 HISTORY — DX: Allergic rhinitis, unspecified: J30.9

## 2019-03-30 HISTORY — DX: Anemia, unspecified: D64.9

## 2019-03-30 HISTORY — DX: Obesity, unspecified: E66.9

## 2019-03-30 HISTORY — DX: Acute tonsillitis, unspecified: J03.90

## 2019-03-30 HISTORY — PX: TONSILLECTOMY: SHX5217

## 2019-03-30 LAB — POCT PREGNANCY, URINE: Preg Test, Ur: NEGATIVE

## 2019-03-30 SURGERY — TONSILLECTOMY
Anesthesia: General | Site: Throat | Laterality: Bilateral

## 2019-03-30 MED ORDER — FENTANYL CITRATE (PF) 100 MCG/2ML IJ SOLN
INTRAMUSCULAR | Status: DC | PRN
  Administered 2019-03-30: 50 ug via INTRAVENOUS
  Administered 2019-03-30: 100 ug via INTRAVENOUS

## 2019-03-30 MED ORDER — ONDANSETRON HCL 4 MG/2ML IJ SOLN
INTRAMUSCULAR | Status: DC | PRN
  Administered 2019-03-30: 4 mg via INTRAVENOUS

## 2019-03-30 MED ORDER — OXYCODONE HCL 5 MG/5ML PO SOLN
5.0000 mg | Freq: Once | ORAL | Status: AC | PRN
  Administered 2019-03-30: 5 mg via ORAL

## 2019-03-30 MED ORDER — LIDOCAINE HCL (CARDIAC) PF 100 MG/5ML IV SOSY
PREFILLED_SYRINGE | INTRAVENOUS | Status: DC | PRN
  Administered 2019-03-30: 40 mg via INTRAVENOUS

## 2019-03-30 MED ORDER — DEXAMETHASONE SODIUM PHOSPHATE 4 MG/ML IJ SOLN
INTRAMUSCULAR | Status: DC | PRN
  Administered 2019-03-30: 10 mg via INTRAVENOUS

## 2019-03-30 MED ORDER — PROPOFOL 10 MG/ML IV BOLUS
INTRAVENOUS | Status: DC | PRN
  Administered 2019-03-30: 150 mg via INTRAVENOUS

## 2019-03-30 MED ORDER — ACETAMINOPHEN 10 MG/ML IV SOLN
1000.0000 mg | Freq: Once | INTRAVENOUS | Status: AC
  Administered 2019-03-30: 1000 mg via INTRAVENOUS

## 2019-03-30 MED ORDER — MIDAZOLAM HCL 5 MG/5ML IJ SOLN
INTRAMUSCULAR | Status: DC | PRN
  Administered 2019-03-30: 2 mg via INTRAVENOUS

## 2019-03-30 MED ORDER — GLYCOPYRROLATE 0.2 MG/ML IJ SOLN
INTRAMUSCULAR | Status: DC | PRN
  Administered 2019-03-30: 0.1 mg via INTRAVENOUS

## 2019-03-30 MED ORDER — OXYCODONE HCL 5 MG PO TABS: 5.0000 mg | ORAL_TABLET | Freq: Once | ORAL | Status: AC | PRN

## 2019-03-30 MED ORDER — LACTATED RINGERS IV SOLN: INTRAVENOUS | Status: DC

## 2019-03-30 MED ORDER — LACTATED RINGERS IV SOLN
INTRAVENOUS | Status: DC
  Administered 2019-03-30: 08:00:00 via INTRAVENOUS

## 2019-03-30 MED ORDER — FENTANYL CITRATE (PF) 100 MCG/2ML IJ SOLN
25.0000 ug | INTRAMUSCULAR | Status: DC | PRN
  Administered 2019-03-30: 50 ug via INTRAVENOUS

## 2019-03-30 MED ORDER — ONDANSETRON HCL 4 MG/2ML IJ SOLN: 4.0000 mg | Freq: Once | INTRAMUSCULAR | Status: DC | PRN

## 2019-03-30 MED ORDER — SUCCINYLCHOLINE CHLORIDE 20 MG/ML IJ SOLN
INTRAMUSCULAR | Status: DC | PRN
  Administered 2019-03-30: 80 mg via INTRAVENOUS

## 2019-03-30 MED ORDER — HYDROCODONE-ACETAMINOPHEN 7.5-325 MG/15ML PO SOLN: 15.0000 mL | Freq: Four times a day (QID) | ORAL | 0 refills | Status: DC | PRN

## 2019-03-30 SURGICAL SUPPLY — 19 items
"PENCIL ELECTRO HAND CTR " (MISCELLANEOUS) ×1 IMPLANT
BASIN GRAD PLASTIC 32OZ STRL (MISCELLANEOUS) ×3 IMPLANT
BLADE BOVIE TIP EXT 4 (BLADE) ×3 IMPLANT
CANISTER SUCT 1200ML W/VALVE (MISCELLANEOUS) ×3 IMPLANT
ELECT CAUTERY BLADE TIP 2.5 (TIP) ×3
ELECT REM PT RETURN 9FT ADLT (ELECTROSURGICAL) ×3
ELECTRODE CAUTERY BLDE TIP 2.5 (TIP) ×1 IMPLANT
ELECTRODE REM PT RTRN 9FT ADLT (ELECTROSURGICAL) ×1 IMPLANT
GLOVE PI ULTRA LF STRL 7.5 (GLOVE) ×1 IMPLANT
GLOVE PI ULTRA NON LATEX 7.5 (GLOVE) ×2
KIT TURNOVER KIT A (KITS) ×3 IMPLANT
PACK TONSIL AND ADENOID CUSTOM (PACKS) ×3 IMPLANT
PENCIL ELECTRO HAND CTR (MISCELLANEOUS) ×3 IMPLANT
PENCIL SMOKE EVACUATOR (MISCELLANEOUS) ×3 IMPLANT
SLEEVE SUCTION 125 (MISCELLANEOUS) ×3 IMPLANT
SOL ANTI-FOG 6CC FOG-OUT (MISCELLANEOUS) ×1 IMPLANT
SOL FOG-OUT ANTI-FOG 6CC (MISCELLANEOUS) ×2
SPONGE TONSIL .75 RFD DBL STRL (DISPOSABLE) ×2 IMPLANT
STRAP BODY AND KNEE 60X3 (MISCELLANEOUS) ×3 IMPLANT

## 2019-03-30 NOTE — Transfer of Care (Signed)
Immediate Anesthesia Transfer of Care Note  Patient: Marine scientist  Procedure(s) Performed: TONSILLECTOMY (Bilateral Throat)  Patient Location: PACU  Anesthesia Type: General  Level of Consciousness: awake, alert  and patient cooperative  Airway and Oxygen Therapy: Patient Spontanous Breathing and Patient connected to supplemental oxygen  Post-op Assessment: Post-op Vital signs reviewed, Patient's Cardiovascular Status Stable, Respiratory Function Stable, Patent Airway and No signs of Nausea or vomiting  Post-op Vital Signs: Reviewed and stable  Complications: No apparent anesthesia complications

## 2019-03-30 NOTE — Op Note (Signed)
03/30/2019  9:05 AM    Altergott, Bubba Hales  [id number]   Pre-Op Dx: Chronic tonsillitis  Post-op Dx: Chronic tonsillitis  Proc: Tonsillectomy  Surg:  Elon Alas Kaylor Simenson  Anes:  GOT  EBL: 10 mL  Comp: None  Findings: Large cryptic tonsils on both sides with lots of debris inside the tonsils   Procedure: The patient was brought to the operating room and placed in supine position.  She was given general anesthesia by oral endotracheal intubation.  A Shaune Pascal was used to visualize the oropharynx.  The tonsils were noted to be 2+ and cryptic.  The soft palate was retracted and the adenoid tissue was noted to be flat in the nasopharynx and not obstructing the airway.  The adenoids were not removed.  The left tonsil was grasped and pulled medially.  The anterior pillar was incised.  The tonsil was dissected from its fossa using blunt dissection and electrocautery.  Bleeding was controlled with electrocautery.  The right tonsil was then removed in the similar fashion.  There was almost no bleeding throughout the tonsillectomy procedure and very little bleeding postoperatively that had to be controlled.  The patient tolerated the procedure well.  She was awakened and taken to the recovery room in satisfactory condition.  There were no operative complications.  Dispo:   To PACU to be discharged home  Plan: To follow-up in the office in a couple weeks to make sure she is doing well.  She will be given liquid hydrocodone to use for pain as needed.  She will call if she has problems.  She will push fluids at home to make sure she stays well-hydrated.  Elon Alas Kelliann Pendergraph  03/30/2019 9:05 AM

## 2019-03-30 NOTE — Anesthesia Procedure Notes (Signed)
Procedure Name: Intubation Date/Time: 03/30/2019 8:40 AM Performed by: Mayme Genta, CRNA Pre-anesthesia Checklist: Patient identified, Emergency Drugs available, Suction available, Patient being monitored and Timeout performed Patient Re-evaluated:Patient Re-evaluated prior to induction Oxygen Delivery Method: Circle system utilized Preoxygenation: Pre-oxygenation with 100% oxygen Induction Type: IV induction Ventilation: Mask ventilation without difficulty Laryngoscope Size: Miller and 2 Grade View: Grade I Tube type: Oral Rae Tube size: 7.0 mm Number of attempts: 1 Placement Confirmation: ETT inserted through vocal cords under direct vision,  positive ETCO2 and breath sounds checked- equal and bilateral Tube secured with: Tape Dental Injury: Teeth and Oropharynx as per pre-operative assessment

## 2019-03-30 NOTE — Anesthesia Postprocedure Evaluation (Signed)
Anesthesia Post Note  Patient: Marine scientist  Procedure(s) Performed: TONSILLECTOMY (Bilateral Throat)  Patient location during evaluation: PACU Anesthesia Type: General Level of consciousness: awake and alert, oriented and patient cooperative Pain management: pain level controlled Vital Signs Assessment: post-procedure vital signs reviewed and stable Respiratory status: spontaneous breathing, nonlabored ventilation and respiratory function stable Cardiovascular status: blood pressure returned to baseline and stable Postop Assessment: adequate PO intake Anesthetic complications: no    Darrin Nipper

## 2019-03-30 NOTE — H&P (Signed)
H&P has been reviewed and patient reevaluated, no changes necessary. To be downloaded later.  

## 2019-03-31 ENCOUNTER — Encounter: Payer: Self-pay | Admitting: Otolaryngology

## 2019-04-03 LAB — SURGICAL PATHOLOGY

## 2019-04-11 ENCOUNTER — Encounter: Payer: Self-pay | Admitting: Certified Nurse Midwife

## 2019-04-11 NOTE — Telephone Encounter (Signed)
I have no idea what she is responding to. Can you check it out. Thanks, JML

## 2019-04-12 ENCOUNTER — Telehealth: Payer: Self-pay

## 2019-04-12 NOTE — Telephone Encounter (Signed)
Coronavirus (COVID-19) Are you at risk?  Are you at risk for the Coronavirus (COVID-19)?  To be considered HIGH RISK for Coronavirus (COVID-19), you have to meet the following criteria:  . Traveled to [place], [place], [place], [place] or [place]; or in the [place] to [place], [place], [place], or [place]; and have fever, cough, and shortness of breath within the last 2 weeks of travel OR . Been in close contact with a person diagnosed with COVID-19 within the last 2 weeks and have fever, cough, and shortness of breath . IF YOU DO NOT MEET THESE CRITERIA, YOU ARE CONSIDERED LOW RISK FOR COVID-19.  What to do if you are HIGH RISK for COVID-19?  . If you are having a medical emergency, call 911. . Seek medical care right away. Before you go to a doctor's office, urgent care or emergency department, call ahead and tell them about your recent travel, contact with someone diagnosed with COVID-19, and your symptoms. You should receive instructions from your physician's office regarding next steps of care.  . When you arrive at healthcare provider, tell the healthcare staff immediately you have returned from visiting [place], [place], [place], [place] or [place]; or traveled in the [place] to [place], [place], [place], or [place]; in the last two weeks or you have been in close contact with a person diagnosed with COVID-19 in the last 2 weeks.   . Tell the health care staff about your symptoms: fever, cough and shortness of breath. . After you have been seen by a medical provider, you will be either: o Tested for (COVID-19) and discharged home on quarantine except to seek medical care if symptoms worsen, and asked to  - Stay home and avoid contact with others until you get your results (4-5 days)  - Avoid travel on public transportation if possible (such as bus, train, or airplane) or o Sent to the Emergency Department by EMS for evaluation, COVID-19 testing, and possible  admission depending on your condition and test results.  What to do if you are LOW RISK for COVID-19?  Reduce your risk of any infection by using the same precautions used for avoiding the common cold or flu:  . Wash your hands often with soap and warm water for at least 20 seconds.  If soap and water are not readily available, use an alcohol-based hand sanitizer with at least 60% alcohol.  . If coughing or sneezing, cover your mouth and nose by coughing or sneezing into the elbow areas of your shirt or coat, into a tissue or into your sleeve (not your hands). . Avoid shaking hands with others and consider head nods or verbal greetings only. . Avoid touching your eyes, nose, or mouth with unwashed hands.  . Avoid close contact with people who are Chryl Holten. . Avoid places or events with large numbers of people in one location, like concerts or sporting events. . Carefully consider travel plans you have or are making. . If you are planning any travel outside or inside the [place], visit the CDC's Travelers' Health webpage for the latest health notices. . If you have some symptoms but not all symptoms, continue to monitor at home and seek medical attention if your symptoms worsen. . If you are having a medical emergency, call 911.  04/12/19 SCREENING NEG SLS ADDITIONAL HEALTHCARE OPTIONS FOR PATIENTS  [facility] / e-Visit: [email or web address]         [facility]: [phone]    [facility]: [phone]                   [facility]: [phone]  

## 2019-04-13 ENCOUNTER — Other Ambulatory Visit (HOSPITAL_COMMUNITY)
Admission: RE | Admit: 2019-04-13 | Discharge: 2019-04-13 | Disposition: A | Discharge status: Home / Self Care | Payer: Medicaid Other | Source: Ambulatory Visit | Attending: Certified Nurse Midwife | Admitting: Certified Nurse Midwife

## 2019-04-13 ENCOUNTER — Encounter: Payer: Self-pay | Admitting: Certified Nurse Midwife

## 2019-04-13 ENCOUNTER — Ambulatory Visit (INDEPENDENT_AMBULATORY_CARE_PROVIDER_SITE_OTHER): Payer: Medicaid Other | Admitting: Certified Nurse Midwife

## 2019-04-13 ENCOUNTER — Other Ambulatory Visit: Payer: Self-pay

## 2019-04-13 VITALS — BP 119/83 | HR 107 | Ht 64.0 in | Wt 208.2 lb

## 2019-04-13 DIAGNOSIS — N941 Unspecified dyspareunia: Secondary | ICD-10-CM | POA: Diagnosis present

## 2019-04-13 DIAGNOSIS — N898 Other specified noninflammatory disorders of vagina: Secondary | ICD-10-CM | POA: Diagnosis present

## 2019-04-13 NOTE — Progress Notes (Signed)
GYN ENCOUNTER NOTE  Subjective:       Gabrielle Byrd is a 23 y.o. G0P0000 female is here for gynecologic evaluation of the following issues:  1. Dyspareunia 2. Vaginal discharge  3. Vaginal odor  Reports thick, yellow vaginal discharge and odor for the last week. Noted pain with intercourse approximately three (3) days ago.   Has been using boric acid purchased from [facility] for the last month in attempt to treat symptoms at home.   Denies difficulty breathing or respiratory distress, chest pain, abdominal pain, dysuria, excessive vaginal bleeding, and leg pain or swelling.    Gynecologic History  Patient's last menstrual period was 03/25/2019 (exact date). Period Duration (Days): 5 Period Pattern: (!) Irregular Menstrual Flow: Moderate Menstrual Control: Maxi pad Dysmenorrhea: (!) Mild Dysmenorrhea Symptoms: Cramping  Contraception: OCP (estrogen/progesterone)  Last Pap: N/A.   Obstetric History  OB History  Gravida Para Term Preterm AB Living  0 0 0 0 0 0  SAB TAB Ectopic Multiple Live Births  0 0 0 0 0    Past Medical History:  Diagnosis Date  . Anemia    Hx of  . Asthma   . Obesity (BMI 30-39.9)   . Rhinitis, allergic   . Tonsillitis    chronic    Past Surgical History:  Procedure Laterality Date  . TONSILLECTOMY Bilateral 03/30/2019   Procedure: TONSILLECTOMY;  Surgeon: Margaretha Sheffield, MD;  Location: [place];  Service: ENT;  Laterality: Bilateral;  . WISDOM TOOTH EXTRACTION      Current Outpatient Medications on File Prior to Visit  Medication Sig Dispense Refill  . clindamycin-benzoyl peroxide (BENZACLIN) gel Apply topically 2 (two) times daily.    . drospirenone-ethinyl estradiol (YAZ) 3-0.02 MG tablet Take 1 tablet by mouth daily. 3 Package 4  . fluticasone (FLONASE) 50 MCG/ACT nasal spray Place 2 sprays into both nostrils daily.  5  . meloxicam (MOBIC) 7.5 MG tablet Take 7.5 mg by mouth 2 (two) times a day.    . montelukast (SINGULAIR)  10 MG tablet Take 10 mg by mouth at bedtime.    Marland Kitchen PROAIR HFA 108 (90 Base) MCG/ACT inhaler USE 2 INHALATIONS EVERY FOUR TO SIX HOURS AS NEEDED  5   No current facility-administered medications on file prior to visit.     No Known Allergies  Social History   Socioeconomic History  . Marital status: Single    Spouse name: Not on file  . Number of children: Not on file  . Years of education: Not on file  . Highest education level: Not on file  Occupational History  . Not on file  Social Needs  . Financial resource strain: Not on file  . Food insecurity    Worry: Not on file    Inability: Not on file  . Transportation needs    Medical: Not on file    Non-medical: Not on file  Tobacco Use  . Smoking status: Never Smoker  . Smokeless tobacco: Never Used  Substance and Sexual Activity  . Alcohol use: Yes    Comment: may drink 1-2x/month  . Drug use: No  . Sexual activity: Yes    Birth control/protection: Pill  Lifestyle  . Physical activity    Days per week: Not on file    Minutes per session: Not on file  . Stress: Not on file  Relationships  . Social Herbalist on phone: Not on file    Gets together: Not on file  Attends religious service: Not on file    Active member of club or organization: Not on file    Attends meetings of clubs or organizations: Not on file    Relationship status: Not on file  . Intimate partner violence    Fear of current or ex partner: Not on file    Emotionally abused: Not on file    Physically abused: Not on file    Forced sexual activity: Not on file  Other Topics Concern  . Not on file  Social History Narrative  . Not on file    Family History  Problem Relation Age of Onset  . Asthma Mother   . Rheum arthritis Mother   . Hypertension Mother   . Depression Mother   . Heart failure Father   . Hypertension Father   . Heart disease Father   . Diabetes Maternal Grandmother   . Rheum arthritis Maternal Grandmother   .  Heart failure Maternal Grandfather   . Cancer Maternal Grandfather   . Rheum arthritis Maternal Grandfather   . Ovarian cancer Maternal Aunt   . Breast cancer Neg Hx   . Colon cancer Neg Hx     The following portions of the patient's history were reviewed and updated as appropriate: allergies, current medications, past family history, past medical history, past social history, past surgical history and problem list.  Review of Systems  ROS negative except as noted above. Information obtained from patient.   Objective:   BP 119/83   Pulse (!) 107   Ht 5\' 4"  (1.626 m)   Wt 208 lb 3.2 oz (94.4 kg)   LMP 03/25/2019 (Exact Date)   BMI 35.74 kg/m    CONSTITUTIONAL: Well-developed, well-nourished female in no acute distress.   ABDOMEN: Soft, non distended; Non tender.  No Organomegaly.  PELVIC:  External Genitalia: Normal  Vagina: Normal  Cervix: Normal  Uterus: Normal size, shape,consistency, mobile  Adnexa: Normal  MUSCULOSKELETAL: Normal range of motion. No tenderness.  No cyanosis, clubbing, or edema.   Assessment:   1. Female dyspareunia  - Cervicovaginal ancillary only  2. Vaginal discharge  - Cervicovaginal ancillary only  3. Vaginal odor  - Cervicovaginal ancillary only  Plan:   Vaginal swab collected, see orders. Will contact patient with results.   Discussed home vaginal health techniques.   Reviewed red flag symptoms and when to call.   RTC as needed.    Gunnar BullaJenkins Michelle Lawhorn, CNM [facility], [facility]

## 2019-04-13 NOTE — Patient Instructions (Signed)
Vaginitis Vaginitis is a condition in which the vaginal tissue swells and becomes red (inflamed). This condition is most often caused by a change in the normal balance of bacteria and yeast that live in the vagina. This change causes an overgrowth of certain bacteria or yeast, which causes the inflammation. There are different types of vaginitis, but the most common types are:  Bacterial vaginosis.  Yeast infection (candidiasis).  Trichomoniasis vaginitis. This is a sexually transmitted disease (STD).  Viral vaginitis.  Atrophic vaginitis.  Allergic vaginitis. What are the causes? The cause of this condition depends on the type of vaginitis. It can be caused by:  Bacteria (bacterial vaginosis).  Yeast, which is a fungus (yeast infection).  A parasite (trichomoniasis vaginitis).  A virus (viral vaginitis).  Low hormone levels (atrophic vaginitis). Low hormone levels can occur during pregnancy, breastfeeding, or after menopause.  Irritants, such as bubble baths, scented tampons, and feminine sprays (allergic vaginitis). Other factors can change the normal balance of the yeast and bacteria that live in the vagina. These include:  Antibiotic medicines.  Poor hygiene.  Diaphragms, vaginal sponges, spermicides, birth control pills, and intrauterine devices (IUD).  Sex.  Infection.  Uncontrolled diabetes.  A weakened defense (immune) system. What increases the risk? This condition is more likely to develop in women who:  Smoke.  Use vaginal douches, scented tampons, or scented sanitary pads.  Wear tight-fitting pants.  Wear thong underwear.  Use oral birth control pills or an IUD.  Have sex without a condom.  Have multiple sex partners.  Have an STD.  Frequently use the spermicide nonoxynol-9.  Eat lots of foods high in sugar.  Have uncontrolled diabetes.  Have low estrogen levels.  Have a weakened immune system from an immune disorder or medical  treatment.  Are pregnant or breastfeeding. What are the signs or symptoms? Symptoms vary depending on the cause of the vaginitis. Common symptoms include:  Abnormal vaginal discharge. ? The discharge is white, gray, or yellow with bacterial vaginosis. ? The discharge is thick, white, and cheesy with a yeast infection. ? The discharge is frothy and yellow or greenish with trichomoniasis.  A bad vaginal smell. The smell is fishy with bacterial vaginosis.  Vaginal itching, pain, or swelling.  Sex that is painful.  Pain or burning when urinating. Sometimes there are no symptoms. How is this diagnosed? This condition is diagnosed based on your symptoms and medical history. A physical exam, including a pelvic exam, will also be done. You may also have other tests, including:  Tests to determine the pH level (acidity or alkalinity) of your vagina.  A whiff test, to assess the odor that results when a sample of your vaginal discharge is mixed with a potassium hydroxide solution.  Tests of vaginal fluid. A sample will be examined under a microscope. How is this treated? Treatment varies depending on the type of vaginitis you have. Your treatment may include:  Antibiotic creams or pills to treat bacterial vaginosis and trichomoniasis.  Antifungal medicines, such as vaginal creams or suppositories, to treat a yeast infection.  Medicine to ease discomfort if you have viral vaginitis. Your sexual partner should also be treated.  Estrogen delivered in a cream, pill, suppository, or vaginal ring to treat atrophic vaginitis. If vaginal dryness occurs, lubricants and moisturizing creams may help. You may need to avoid scented soaps, sprays, or douches.  Stopping use of a product that is causing allergic vaginitis. Then using a vaginal cream to treat the symptoms. Follow   these instructions at home: Lifestyle  Keep your genital area clean and dry. Avoid soap, and only rinse the area with  water.  Do not douche or use tampons until your health care provider says it is okay to do so. Use sanitary pads, if needed.  Do not have sex until your health care provider approves. When you can return to sex, practice safe sex and use condoms.  Wipe from front to back. This avoids the spread of bacteria from the rectum to the vagina. General instructions  Take over-the-counter and prescription medicines only as told by your health care provider.  If you were prescribed an antibiotic medicine, take or use it as told by your health care provider. Do not stop taking or using the antibiotic even if you start to feel better.  Keep all follow-up visits as told by your health care provider. This is important. How is this prevented?  Use mild, non-scented products. Do not use things that can irritate the vagina, such as fabric softeners. Avoid the following products if they are scented: ? Feminine sprays. ? Detergents. ? Tampons. ? Feminine hygiene products. ? Soaps or bubble baths.  Let air reach your genital area. ? Wear cotton underwear to reduce moisture buildup. ? Avoid wearing underwear while you sleep. ? Avoid wearing tight pants and underwear or nylons without a cotton panel. ? Avoid wearing thong underwear.  Take off any wet clothing, such as bathing suits, as soon as possible.  Practice safe sex and use condoms. Contact a health care provider if:  You have abdominal pain.  You have a fever.  You have symptoms that last for more than 2-3 days. Get help right away if:  You have a fever and your symptoms suddenly get worse. Summary  Vaginitis is a condition in which the vaginal tissue becomes inflamed.This condition is most often caused by a change in the normal balance of bacteria and yeast that live in the vagina.  Treatment varies depending on the type of vaginitis you have.  Do not douche, use tampons , or have sex until your health care provider approves. When  you can return to sex, practice safe sex and use condoms. This information is not intended to replace advice given to you by your health care provider. Make sure you discuss any questions you have with your health care provider. Document Released: 08/02/2007 Document Revised: 11/10/2016 Document Reviewed: 11/10/2016 Elsevier Interactive Patient Education  2019 Elsevier Inc. Dyspareunia, Female  Dyspareunia is pain that is associated with sexual activity. This can affect any part of the genitals or lower abdomen, and there are many possible causes. This condition ranges from mild to severe. Depending on the cause, dyspareunia may get better with treatment, or it may return (recur) over time. What are the causes? The cause of this condition is not always known. Possible causes include:  Cancer.  Psychological factors, such as depression, anxiety, or previous traumatic experiences.  Severe pain and tenderness of the skin around the vagina (vulva) when it is touched (vulvar vestibulitis syndrome).  Infection of the pelvis or the vulva.  Infection of the vagina.  Painful, involuntary tightening (contraction) of the vaginal muscles when anything is put inside the vagina (vaginismus).  Allergic reaction.  Ovarian cysts.  Solid growths of tissue (tumors) in the ovaries or the uterus.  Scar tissue in the ovaries, vagina, or pelvis.  Vaginal dryness.  Thinning of the tissue (atrophy) of the vulva and vagina.  Skin conditions that affect  the vulva (vulvar dermatoses), such as lichen sclerosus or lichen planus.  Endometriosis.  Tubal pregnancy.  A tilted uterus.  Uterine prolapse.  Adhesions in the vagina.  Bladder problems.  Intestinal problems.  Certain medicines.  Medical conditions such as diabetes, arthritis, or thyroid disease. What increases the risk? The following factors may make you more likely to develop this condition:  Having experienced physical or sexual  trauma.  Having given birth more than once.  Taking birth control pills.  Having gone through menopause.  Having recently given birth, typically within the past 3-6 months.  Breastfeeding. What are the signs or symptoms? The main symptom of this condition is pain in any part of the genitals or lower abdomen during or after sexual activity. This may include pain during sexual arousal, genital stimulation, or orgasm. Pain may get worse when anything is inserted into the vagina, or when the genitals are touched in any way, such as when sitting or wearing pants. Pain can range from mild to severe, depending on the cause of the condition. In some cases, symptoms go away with treatment and return (recur) at a later date. How is this diagnosed? This condition may be diagnosed based on:  Your symptoms, including: ? Where your pain is located. ? When your pain occurs.  Your medical history.  A physical exam. This may include a pelvic exam and a Pap test. This is a screening test that is used to check for signs of cancer of the vagina, cervix, and uterus.  Tests, including: ? Blood tests. ? Ultrasound. This uses sound waves to make a picture of the area that is being tested. ? Urine culture. This test involves checking a urine sample for signs of infection. ? Culture test. This is when your health care provider uses a swab to collect a sample of vaginal fluid. The sample is checked for signs of infection. ? X-rays. ? MRI. ? CT scan. ? Laparoscopy. This is a procedure in which a small incision is made in your lower abdomen and a lighted, pencil-sized instrument (laparoscope) is passed through the incision and used to look inside your pelvis. You may be referred to a health care provider who specializes in women's health (gynecologist). In some cases, diagnosing the cause of dyspareunia can be difficult. How is this treated? Treatment depends on the cause of your condition and your symptoms.  In most cases, you may need to stop sexual activity until your symptoms improve. Treatment may include:  Lubricants.  Kegel exercises or vaginal dilators.  Medicated skin creams.  Medicated vaginal creams.  Hormonal therapy.  Antibiotic medicine to prevent or fight infection.  Medicines that help to relieve pain.  Medicines that treat depression (antidepressants).  Psychological counseling.  Sex therapy.  Surgery. Follow these instructions at home: Lifestyle  Avoid tight clothing and irritating materials around your genital and abdominal area.  Use water-based lubricants as needed. Avoid oil-based lubricants.  Do not use any products that irritate you. This may include certain condoms, spermicides, lubricants, soaps, tampons, vaginal sprays, or douches.  Always practice safe sex. Talk with your health care provider about which form of birth control (contraception) is best for you.  Maintain open communication with your sexual partner. General instructions  Take over-the-counter and prescription medicines only as told by your health care provider.  If you had tests done, it is your responsibility to get your tests results. Ask your health care provider or the department performing the test when your results will  be ready.  Urinate before you engage in sexual activity.  Consider joining a support group.  Keep all follow-up visits as told by your health care provider. This is important. Contact a health care provider if:  You develop vaginal bleeding after sexual intercourse.  You develop a lump at the opening of your vagina. Seek medical care even if the lump is painless.  You have: ? Abnormal vaginal discharge. ? Vaginal dryness. ? Itchiness or irritation of your vulva or vagina. ? A new rash. ? Symptoms that get worse or do not improve with treatment. ? A fever. ? Pain when you urinate. ? Blood in your urine. Get help right away if:  You develop severe  pain in your abdomen during or shortly after sexual intercourse.  You pass out after having sexual intercourse. This information is not intended to replace advice given to you by your health care provider. Make sure you discuss any questions you have with your health care provider. Document Released: 10/25/2007 Document Revised: 02/14/2016 Document Reviewed: 05/07/2015 Elsevier Interactive Patient Education  [employer]2019 [employer] Inc[employer]

## 2019-04-13 NOTE — Progress Notes (Signed)
Patient c/o thick yellow vaginal d/c and odor x1 week, aslo c/o pain with intercourse 3 days ago.

## 2019-04-18 LAB — CERVICOVAGINAL ANCILLARY ONLY
Bacterial vaginitis: POSITIVE — AB
Candida vaginitis: POSITIVE — AB
Chlamydia: NEGATIVE
Neisseria Gonorrhea: NEGATIVE
Trichomonas: NEGATIVE

## 2019-04-20 ENCOUNTER — Other Ambulatory Visit: Payer: Self-pay

## 2019-04-20 ENCOUNTER — Encounter: Payer: Self-pay | Admitting: Certified Nurse Midwife

## 2019-04-20 MED ORDER — FLUCONAZOLE 150 MG PO TABS: 150.0000 mg | ORAL_TABLET | Freq: Once | ORAL | 0 refills | Status: AC

## 2019-04-20 MED ORDER — METRONIDAZOLE 500 MG PO TABS: 500.0000 mg | ORAL_TABLET | Freq: Two times a day (BID) | ORAL | 0 refills | Status: DC

## 2019-06-22 ENCOUNTER — Encounter: Payer: Self-pay | Admitting: Certified Nurse Midwife

## 2019-06-22 ENCOUNTER — Other Ambulatory Visit: Payer: Self-pay

## 2019-06-22 ENCOUNTER — Ambulatory Visit (INDEPENDENT_AMBULATORY_CARE_PROVIDER_SITE_OTHER): Payer: Medicaid Other | Admitting: Certified Nurse Midwife

## 2019-06-22 ENCOUNTER — Other Ambulatory Visit (HOSPITAL_COMMUNITY)
Admission: RE | Admit: 2019-06-22 | Discharge: 2019-06-22 | Disposition: A | Discharge status: Home / Self Care | Payer: Medicaid Other | Source: Ambulatory Visit | Attending: Certified Nurse Midwife | Admitting: Certified Nurse Midwife

## 2019-06-22 VITALS — BP 105/68 | HR 74 | Ht 63.0 in | Wt 202.3 lb

## 2019-06-22 DIAGNOSIS — N898 Other specified noninflammatory disorders of vagina: Secondary | ICD-10-CM | POA: Diagnosis not present

## 2019-06-22 NOTE — Progress Notes (Signed)
GYN ENCOUNTER NOTE  Subjective:       Gabrielle Byrd is a 23 y.o. G0P0000 female is here for gynecologic evaluation of the following issues:  1. Vaginal discharge 2. Vaginal odor 3. Vaginal dryness  Reports symptoms for the last week. Thin, watery discharge with occasional "clumps" and odor. No relief with twice weekly use of boric acid, last dose yesterday.   Questions if low estrogen level is the reason for her vaginal dryness.   Denies difficulty breathing or respiratory distress, chest pain, abdominal pain, vaginal bleeding, dysuria, and leg pain or swelling.    Gynecologic History  Patient's last menstrual period was 06/05/2019. Period Duration (Days): 5 Period Pattern: (!) Irregular Menstrual Flow: Heavy Menstrual Control: Maxi pad Dysmenorrhea: (!) Mild Dysmenorrhea Symptoms: Cramping 0 Contraception: none  Last Pap: N/A  Obstetric History  OB History  Gravida Para Term Preterm AB Living  0 0 0 0 0 0  SAB TAB Ectopic Multiple Live Births  0 0 0 0 0    Past Medical History:  Diagnosis Date  . Anemia    Hx of  . Asthma   . Obesity (BMI 30-39.9)   . Rhinitis, allergic   . Tonsillitis    chronic    Past Surgical History:  Procedure Laterality Date  . TONSILLECTOMY Bilateral 03/30/2019   Procedure: TONSILLECTOMY;  Surgeon: Margaretha Sheffield, MD;  Location: [facility];  Service: ENT;  Laterality: Bilateral;  . WISDOM TOOTH EXTRACTION      Current Outpatient Medications on File Prior to Visit  Medication Sig Dispense Refill  . clindamycin-benzoyl peroxide (BENZACLIN) gel Apply topically 2 (two) times daily.    . fluticasone (FLONASE) 50 MCG/ACT nasal spray Place 2 sprays into both nostrils daily.  5  . meloxicam (MOBIC) 7.5 MG tablet Take 7.5 mg by mouth 2 (two) times a day.    . montelukast (SINGULAIR) 10 MG tablet Take 10 mg by mouth at bedtime.    Marland Kitchen PROAIR HFA 108 (90 Base) MCG/ACT inhaler USE 2 INHALATIONS EVERY FOUR TO SIX HOURS AS NEEDED  5   . drospirenone-ethinyl estradiol (YAZ) 3-0.02 MG tablet Take 1 tablet by mouth daily. (Patient not taking: Reported on 06/22/2019) 3 Package 4   No current facility-administered medications on file prior to visit.     No Known Allergies  Social History   Socioeconomic History  . Marital status: Single    Spouse name: Not on file  . Number of children: Not on file  . Years of education: Not on file  . Highest education level: Not on file  Occupational History  . Not on file  Social Needs  . Financial resource strain: Not on file  . Food insecurity    Worry: Not on file    Inability: Not on file  . Transportation needs    Medical: Not on file    Non-medical: Not on file  Tobacco Use  . Smoking status: Never Smoker  . Smokeless tobacco: Never Used  Substance and Sexual Activity  . Alcohol use: Yes    Comment: may drink 1-2x/month  . Drug use: No  . Sexual activity: Yes    Birth control/protection: None  Lifestyle  . Physical activity    Days per week: Not on file    Minutes per session: Not on file  . Stress: Not on file  Relationships  . Social Herbalist on phone: Not on file    Gets together: Not on file  Attends religious service: Not on file    Active member of club or organization: Not on file    Attends meetings of clubs or organizations: Not on file    Relationship status: Not on file  . Intimate partner violence    Fear of current or ex partner: Not on file    Emotionally abused: Not on file    Physically abused: Not on file    Forced sexual activity: Not on file  Other Topics Concern  . Not on file  Social History Narrative  . Not on file    Family History  Problem Relation Age of Onset  . Asthma Mother   . Rheum arthritis Mother   . Hypertension Mother   . Depression Mother   . Heart failure Father   . Hypertension Father   . Heart disease Father   . Diabetes Maternal Grandmother   . Rheum arthritis Maternal Grandmother   .  Heart failure Maternal Grandfather   . Cancer Maternal Grandfather   . Rheum arthritis Maternal Grandfather   . Ovarian cancer Maternal Aunt   . Breast cancer Neg Hx   . Colon cancer Neg Hx     The following portions of the patient's history were reviewed and updated as appropriate: allergies, current medications, past family history, past medical history, past social history, past surgical history and problem list.  Review of Systems  ROS negative except as noted above. Information obtained from patient.   Objective:   BP 105/68   Pulse 74   Ht 5\' 3"  (1.6 m)   Wt 202 lb 4.8 oz (91.8 kg)   LMP 05/24/2019 (Approximate)   BMI 35.84 kg/m    CONSTITUTIONAL: Well-developed, well-nourished female in no acute distress.   PELVIC:  External Genitalia: Normal  Vagina: Thin, white discharge present; swab collected  MUSCULOSKELETAL: Normal range of motion. No tenderness.  No cyanosis, clubbing, or edema.  Assessment:   1. Vaginal discharge  - Cervicovaginal ancillary only  2. Vaginal dryness  - Cervicovaginal ancillary only  3. Vaginal odor    Plan:   Vaginal swab collected, see orders.   Discussed home vaginal health techniques.   Desires to discontinue OCPs at this time. Agrees to contact in three (3) months if no menses for Provera Challenge.   Reviewed red flag symptoms and when to call.   RTC as needed.    Gunnar BullaJenkins Michelle Beryle Zeitz, CNM [facility], [facility] 06/22/19 10:37 AM

## 2019-06-22 NOTE — Patient Instructions (Signed)
Atrophic Vaginitis Atrophic vaginitis is a condition in which the tissues that line the vagina become dry and thin. This condition occurs in women who have stopped having their period. It is caused by a drop in a female hormone (estrogen). This hormone helps:  To keep the vagina moist.  To make a clear fluid. This clear fluid helps: ? To make the vagina ready for sex. ? To protect the vagina from infection. If the lining of the vagina is dry and thin, it may cause irritation, burning, or itchiness. It may also:  Make sex painful.  Make an exam of your vagina painful.  Cause bleeding.  Make you lose interest in sex.  Cause a burning feeling when you pee (urinate).  Cause a brown or yellow fluid to come from your vagina. Some women do not have symptoms. Follow these instructions at home: Medicines  Take over-the-counter and prescription medicines only as told by your doctor.  Do not use herbs or other medicines unless your doctor says it is okay.  Use medicines for for dryness. These include: ? Oils to make the vagina soft. ? Creams. ? Moisturizers. General instructions  Do not douche.  Do not use products that can make your vagina dry. These include: ? Scented sprays. ? Scented tampons. ? Scented soaps.  Sex can help increase blood flow and soften the tissue in the vagina. If it hurts to have sex: ? Tell your partner. ? Use products to make sex more comfortable. Use these only as told by your doctor. Contact a doctor if you:  Have discharge from the vagina that is different than usual.  Have a bad smell coming from your vagina.  Have new symptoms.  Do not get better.  Get worse. Summary  Atrophic vaginitis is a condition in which the lining of the vagina becomes dry and thin.  This condition affects women who have stopped having their periods.  Treatment may include using products that help make the vagina soft.  Call a doctor if do not get better with  treatment. This information is not intended to replace advice given to you by your health care provider. Make sure you discuss any questions you have with your health care provider. Document Released: 03/23/2008 Document Revised: 10/18/2017 Document Reviewed: 10/18/2017 Elsevier Patient Education  2020 [employer]. Vaginitis Vaginitis is a condition in which the vaginal tissue swells and becomes red (inflamed). This condition is most often caused by a change in the normal balance of bacteria and yeast that live in the vagina. This change causes an overgrowth of certain bacteria or yeast, which causes the inflammation. There are different types of vaginitis, but the most common types are:  Bacterial vaginosis.  Yeast infection (candidiasis).  Trichomoniasis vaginitis. This is a sexually transmitted disease (STD).  Viral vaginitis.  Atrophic vaginitis.  Allergic vaginitis. What are the causes? The cause of this condition depends on the type of vaginitis. It can be caused by:  Bacteria (bacterial vaginosis).  Yeast, which is a fungus (yeast infection).  A parasite (trichomoniasis vaginitis).  A virus (viral vaginitis).  Low hormone levels (atrophic vaginitis). Low hormone levels can occur during pregnancy, breastfeeding, or after menopause.  Irritants, such as bubble baths, scented tampons, and feminine sprays (allergic vaginitis). Other factors can change the normal balance of the yeast and bacteria that live in the vagina. These include:  Antibiotic medicines.  Poor hygiene.  Diaphragms, vaginal sponges, spermicides, birth control pills, and intrauterine devices (IUD).  Sex.  Infection.  Uncontrolled diabetes.  A weakened defense (immune) system. What increases the risk? This condition is more likely to develop in women who:  Smoke.  Use vaginal douches, scented tampons, or scented sanitary pads.  Wear tight-fitting pants.  Wear thong underwear.  Use oral  birth control pills or an IUD.  Have sex without a condom.  Have multiple sex partners.  Have an STD.  Frequently use the spermicide nonoxynol-9.  Eat lots of foods high in sugar.  Have uncontrolled diabetes.  Have low estrogen levels.  Have a weakened immune system from an immune disorder or medical treatment.  Are pregnant or breastfeeding. What are the signs or symptoms? Symptoms vary depending on the cause of the vaginitis. Common symptoms include:  Abnormal vaginal discharge. ? The discharge is white, gray, or yellow with bacterial vaginosis. ? The discharge is thick, white, and cheesy with a yeast infection. ? The discharge is frothy and yellow or greenish with trichomoniasis.  A bad vaginal smell. The smell is fishy with bacterial vaginosis.  Vaginal itching, pain, or swelling.  Sex that is painful.  Pain or burning when urinating. Sometimes there are no symptoms. How is this diagnosed? This condition is diagnosed based on your symptoms and medical history. A physical exam, including a pelvic exam, will also be done. You may also have other tests, including:  Tests to determine the pH level (acidity or alkalinity) of your vagina.  A whiff test, to assess the odor that results when a sample of your vaginal discharge is mixed with a potassium hydroxide solution.  Tests of vaginal fluid. A sample will be examined under a microscope. How is this treated? Treatment varies depending on the type of vaginitis you have. Your treatment may include:  Antibiotic creams or pills to treat bacterial vaginosis and trichomoniasis.  Antifungal medicines, such as vaginal creams or suppositories, to treat a yeast infection.  Medicine to ease discomfort if you have viral vaginitis. Your sexual partner should also be treated.  Estrogen delivered in a cream, pill, suppository, or vaginal ring to treat atrophic vaginitis. If vaginal dryness occurs, lubricants and moisturizing  creams may help. You may need to avoid scented soaps, sprays, or douches.  Stopping use of a product that is causing allergic vaginitis. Then using a vaginal cream to treat the symptoms. Follow these instructions at home: Lifestyle  Keep your genital area clean and dry. Avoid soap, and only rinse the area with water.  Do not douche or use tampons until your health care provider says it is okay to do so. Use sanitary pads, if needed.  Do not have sex until your health care provider approves. When you can return to sex, practice safe sex and use condoms.  Wipe from front to back. This avoids the spread of bacteria from the rectum to the vagina. General instructions  Take over-the-counter and prescription medicines only as told by your health care provider.  If you were prescribed an antibiotic medicine, take or use it as told by your health care provider. Do not stop taking or using the antibiotic even if you start to feel better.  Keep all follow-up visits as told by your health care provider. This is important. How is this prevented?  Use mild, non-scented products. Do not use things that can irritate the vagina, such as fabric softeners. Avoid the following products if they are scented: ? Feminine sprays. ? Detergents. ? Tampons. ? Feminine hygiene products. ? Soaps or bubble baths.  Let  air reach your genital area. ? Wear cotton underwear to reduce moisture buildup. ? Avoid wearing underwear while you sleep. ? Avoid wearing tight pants and underwear or nylons without a cotton panel. ? Avoid wearing thong underwear.  Take off any wet clothing, such as bathing suits, as soon as possible.  Practice safe sex and use condoms. Contact a health care provider if:  You have abdominal pain.  You have a fever.  You have symptoms that last for more than 2-3 days. Get help right away if:  You have a fever and your symptoms suddenly get worse. Summary  Vaginitis is a condition  in which the vaginal tissue becomes inflamed.This condition is most often caused by a change in the normal balance of bacteria and yeast that live in the vagina.  Treatment varies depending on the type of vaginitis you have.  Do not douche, use tampons , or have sex until your health care provider approves. When you can return to sex, practice safe sex and use condoms. This information is not intended to replace advice given to you by your health care provider. Make sure you discuss any questions you have with your health care provider. Document Released: 08/02/2007 Document Revised: 09/17/2017 Document Reviewed: 11/10/2016 Elsevier Patient Education  2020 [employer]

## 2019-06-22 NOTE — Progress Notes (Signed)
Patient c/o thin watery vaginal discharge with odor "sometimes it comes out as chunks" x1 week. Stopped OCP last month, went OOT and forgot OCP.

## 2019-06-24 LAB — CERVICOVAGINAL ANCILLARY ONLY
Bacterial vaginitis: POSITIVE — AB
Candida vaginitis: POSITIVE — AB
Chlamydia: NEGATIVE
Neisseria Gonorrhea: NEGATIVE
Trichomonas: NEGATIVE

## 2019-06-30 ENCOUNTER — Encounter: Payer: Self-pay | Admitting: Certified Nurse Midwife

## 2019-07-03 ENCOUNTER — Other Ambulatory Visit: Payer: Self-pay

## 2019-07-03 MED ORDER — METRONIDAZOLE 500 MG PO TABS: 500.0000 mg | ORAL_TABLET | Freq: Two times a day (BID) | ORAL | 0 refills | Status: DC

## 2019-07-03 MED ORDER — FLUCONAZOLE 150 MG PO TABS: 150.0000 mg | ORAL_TABLET | Freq: Once | ORAL | 1 refills | Status: AC

## 2019-07-06 ENCOUNTER — Other Ambulatory Visit: Payer: Self-pay

## 2019-07-06 ENCOUNTER — Ambulatory Visit (INDEPENDENT_AMBULATORY_CARE_PROVIDER_SITE_OTHER): Payer: Medicaid Other | Admitting: Certified Nurse Midwife

## 2019-07-06 ENCOUNTER — Encounter: Payer: Self-pay | Admitting: Certified Nurse Midwife

## 2019-07-06 VITALS — BP 104/58 | HR 58 | Ht 63.0 in | Wt 201.9 lb

## 2019-07-06 DIAGNOSIS — N760 Acute vaginitis: Secondary | ICD-10-CM | POA: Insufficient documentation

## 2019-07-06 NOTE — Progress Notes (Signed)
Patient here for vaginal swab.

## 2019-07-06 NOTE — Progress Notes (Signed)
GYN ENCOUNTER NOTE  Subjective:       Gabrielle Byrd is a 23 y.o. G0P0000 female here for self collection of vaginal swab.   Seen MyChart message on 06/30/2019 questioning Mycoplasma or Ureaplasma as reason for recurrent vaginitis.    Gynecologic History  Patient's last menstrual period was 07/05/2019 (exact date).  Contraception: none  Last Pap: N/A  Obstetric History  OB History  Gravida Para Term Preterm AB Living  0 0 0 0 0 0  SAB TAB Ectopic Multiple Live Births  0 0 0 0 0    Past Medical History:  Diagnosis Date  . Anemia    Hx of  . Asthma   . Obesity (BMI 30-39.9)   . Rhinitis, allergic   . Tonsillitis    chronic    Past Surgical History:  Procedure Laterality Date  . TONSILLECTOMY Bilateral 03/30/2019   Procedure: TONSILLECTOMY;  Surgeon: Margaretha Sheffield, MD;  Location: [facility];  Service: ENT;  Laterality: Bilateral;  . WISDOM TOOTH EXTRACTION      Current Outpatient Medications on File Prior to Visit  Medication Sig Dispense Refill  . clindamycin-benzoyl peroxide (BENZACLIN) gel Apply topically 2 (two) times daily.    . fluticasone (FLONASE) 50 MCG/ACT nasal spray Place 2 sprays into both nostrils daily.  5  . meloxicam (MOBIC) 7.5 MG tablet Take 7.5 mg by mouth 2 (two) times a day.    . metroNIDAZOLE (FLAGYL) 500 MG tablet Take 1 tablet (500 mg total) by mouth 2 (two) times daily. 14 tablet 0  . montelukast (SINGULAIR) 10 MG tablet Take 10 mg by mouth at bedtime.    Marland Kitchen PROAIR HFA 108 (90 Base) MCG/ACT inhaler USE 2 INHALATIONS EVERY FOUR TO SIX HOURS AS NEEDED  5   No current facility-administered medications on file prior to visit.     No Known Allergies  Social History   Socioeconomic History  . Marital status: Single    Spouse name: Not on file  . Number of children: Not on file  . Years of education: Not on file  . Highest education level: Not on file  Occupational History  . Not on file  Social Needs  . Financial resource  strain: Not on file  . Food insecurity    Worry: Not on file    Inability: Not on file  . Transportation needs    Medical: Not on file    Non-medical: Not on file  Tobacco Use  . Smoking status: Never Smoker  . Smokeless tobacco: Never Used  Substance and Sexual Activity  . Alcohol use: Yes    Comment: may drink 1-2x/month  . Drug use: No  . Sexual activity: Yes    Birth control/protection: None  Lifestyle  . Physical activity    Days per week: Not on file    Minutes per session: Not on file  . Stress: Not on file  Relationships  . Social Herbalist on phone: Not on file    Gets together: Not on file    Attends religious service: Not on file    Active member of club or organization: Not on file    Attends meetings of clubs or organizations: Not on file    Relationship status: Not on file  . Intimate partner violence    Fear of current or ex partner: Not on file    Emotionally abused: Not on file    Physically abused: Not on file    Forced sexual  activity: Not on file  Other Topics Concern  . Not on file  Social History Narrative  . Not on file    Family History  Problem Relation Age of Onset  . Asthma Mother   . Rheum arthritis Mother   . Hypertension Mother   . Depression Mother   . Heart failure Father   . Hypertension Father   . Heart disease Father   . Diabetes Maternal Grandmother   . Rheum arthritis Maternal Grandmother   . Heart failure Maternal Grandfather   . Cancer Maternal Grandfather   . Rheum arthritis Maternal Grandfather   . Ovarian cancer Maternal Aunt   . Breast cancer Neg Hx   . Colon cancer Neg Hx     The following portions of the patient's history were reviewed and updated as appropriate: allergies, current medications, past family history, past medical history, past social history, past surgical history and problem list.  Objective:   BP (!) 104/58   Pulse (!) 58   Ht 5\' 3"  (1.6 m)   Wt 201 lb 14.4 oz (91.6 kg)   LMP  07/05/2019 (Exact Date)   BMI 35.76 kg/m    CONSTITUTIONAL: Well-developed, well-nourished female in no acute distress.   PHYSICAL EXAM: Not indicated.   Assessment:   1. Recurrent vaginitis  - Genital Mycoplasmas NAA, Swab  Plan:   Vaginal swab self collected by patient.   See orders, will contact with results.   RTC as needed.    Gunnar BullaJenkins Michelle Sayan Aldava, CNM [facility], [facility] 07/06/19 1:59 PM

## 2019-07-06 NOTE — Patient Instructions (Signed)
Mycoplasma Genitalium Infection A mycoplasma genitalium infection is a bacterial infection that can be passed from person to person during sexual activity. This is called a sexually transmitted infection,or STI. In men, the infection can cause swelling of the tube that carries urine and sperm through the penis (urethra). The swelling of this tube is called urethritis.  In women, the infection may cause:  Swelling of the tube that carries urine from the bladder (urethritis).  Swelling of the inside of the vagina at the opening of the womb (cervicitis).  Infection of the reproductive organs above the cervix (pelvic inflammatory disease).  Difficulty getting pregnant. What are the causes? This infection is caused by Mycoplasma genitalium bacteria. The infection spreads through sexual contact. What increases the risk? You are more likely to get this condition if you:  Have sex without using a condom.  Have another STI.  Have many sexual partners.  Have poor hygiene. What are the signs or symptoms? Symptoms in men Symptoms of this condition in men include:  Burning pain when passing urine.  Blood-tinged urine.  Discharge from the penis.  Itching or tingling in the penis.  Feeling the need to urinate often. Symptoms in women Symptoms of this condition in women include:  Abdominal pain.  Unusual vaginal discharge.  Bleeding between periods.  Pain or bleeding during sexual intercourse.  Burning pain when passing urine. How is this diagnosed? This condition may be diagnosed based on:  You symptoms.  Your medical history.  A physical exam.  Tests that rule out other STIs. Testing may be done on samples of blood, urine, or genital swabs. How is this treated? This condition is treated with antibiotic medicines. Follow these instructions at home: Medicines   Take over-the-counter and prescription medicines only as told by your health care provider.  Take your  antibiotic medicine as told by your health care provider. Do not stop taking the antibiotic even if you start to feel better. Lifestyle   Avoid using perfumed soaps, bubble bath, and shampoo when you bathe or shower. Rinse your genital area after bathing.  Wear breathable, cotton underwear. It may help to go to bed without underwear.  Do not wear tight clothes, such as pantyhose or tight pants.  Make sure to wipe from front to back after using the toilet if you are female.  Do not have sex until your health care provider approves. When you are cleared to have sex, be sure to use a condom every time you have sex. This will reduce your chances of getting the condition again.  Inform your sexual partners of your infection. General instructions  Drink enough fluid to keep your urine pale yellow.  Tell anyone with whom you have had sexual relations in the past 60 days that he or she may be at risk for infection and may need treatment.  Get tested again 3 months after treatment to make sure the infection is gone. It is important that your sexual partner also gets tested again.  Keep all follow-up visits as told by your health care provider. This is important. Contact a health care provider if you:  Do not get better with treatment.  Have symptoms that return after treatment. Get help right away if you:  Have severe pelvic or genital pain. Summary  A mycoplasma genitalium infection is a bacterial infection that can be passed from person to person during sexual activity. It is caused by Mycoplasma genitalium bacteria.  The condition is treated with antibiotic medicine.  Do not stop taking the antibiotic even if you start to feel better.  Do not have sex until your health care provider approves. When you are cleared to have sex, be sure to use a condom every time you have sex. This will reduce your chances of getting the condition again. This information is not intended to replace advice  given to you by your health care provider. Make sure you discuss any questions you have with your health care provider. Document Released: 11/01/2015 Document Revised: 01/27/2019 Document Reviewed: 11/12/2017 Elsevier Patient Education  2020 [employer].

## 2019-07-10 ENCOUNTER — Encounter: Payer: Self-pay | Admitting: Certified Nurse Midwife

## 2019-07-10 ENCOUNTER — Other Ambulatory Visit (INDEPENDENT_AMBULATORY_CARE_PROVIDER_SITE_OTHER): Payer: Medicaid Other | Admitting: Certified Nurse Midwife

## 2019-07-10 DIAGNOSIS — Z2239 Carrier of other specified bacterial diseases: Secondary | ICD-10-CM | POA: Insufficient documentation

## 2019-07-10 DIAGNOSIS — A493 Mycoplasma infection, unspecified site: Secondary | ICD-10-CM | POA: Insufficient documentation

## 2019-07-10 LAB — GENITAL MYCOPLASMAS NAA, SWAB
Mycoplasma genitalium NAA: NEGATIVE
Mycoplasma hominis NAA: POSITIVE — AB
Ureaplasma spp NAA: POSITIVE — AB

## 2019-07-10 MED ORDER — DOXYCYCLINE HYCLATE 100 MG PO CAPS: 100.0000 mg | ORAL_CAPSULE | Freq: Two times a day (BID) | ORAL | 0 refills | Status: DC

## 2019-07-19 ENCOUNTER — Encounter: Payer: Self-pay | Admitting: Certified Nurse Midwife

## 2019-10-20 NOTE — L&D Delivery Note (Signed)
Delivery Note  [birth year]24 In room to see patient, effective coached maternal pushing efforts. Fetal vertex visible with pushing, FHR Category II.   1936 Telephone call to Dr. Logan Bores notifying him of possible vacuum assisted birth due to prolonged decelerations after pushing.   Plan of care discussed with patient and patient wishes to proceed with pushing.   Spontaneous vaginal birth of liveborn female infant at 91 in right occiput anterior position, shoulder dystocia resolved within 45 seconds using McRoberts maneuver, suprapubic pressure and sweep of the anterior shoulder. Infant immediately to maternal abdomen. Cord double clamped and cut after one (1) minute. Cord gas collected. Infant to warmer for further evaluation by receiving nurse and NNP. APGARS; 5, 9. Weight 7 pounds 4 ounces (3280 grams).   Pitocin bolus started. Spontaneous delivery of intact placenta at 1956. First degree perineal laceration repaired with 3-0 vicryl rapide under epidural anesthesia. Laceration well approximated. QBL: 335 ml. Vault check completed. Counts correct x 2. Fundus firm. Rubra small.    Initiate routine postpartum care and orders. Mom to postpartum.  Baby to Couplet care / Skin to Skin.  FOB and patient's mother present at bedside and overjoyed with the birth of "[name]".    Serafina Royals  [facility], [facility] 07/26/2020, 8:56 PM

## 2019-11-21 ENCOUNTER — Telehealth: Payer: Self-pay | Admitting: Certified Nurse Midwife

## 2019-11-21 NOTE — Telephone Encounter (Signed)
Pt called in to schedule her pregnancy conformation the pt wanted to know why it was so long of a wait I told her that she is only 3 weeks we have pts come in from 4-7 weeks. The pt stated that she wants some prenatal vitamins. I told the pt I would send a message back to the nurse. Please advise

## 2019-11-22 ENCOUNTER — Ambulatory Visit (LOCAL_COMMUNITY_HEALTH_CENTER): Payer: Medicaid Other

## 2019-11-22 ENCOUNTER — Other Ambulatory Visit: Payer: Self-pay

## 2019-11-22 VITALS — BP 113/69 | Ht 63.0 in | Wt 191.0 lb

## 2019-11-22 DIAGNOSIS — Z3202 Encounter for pregnancy test, result negative: Secondary | ICD-10-CM | POA: Diagnosis not present

## 2019-11-22 LAB — PREGNANCY, URINE

## 2019-11-22 MED ORDER — PRENATAL VITAMIN 27-0.8 MG PO TABS: 1.0000 | ORAL_TABLET | Freq: Every day | ORAL | 0 refills | Status: DC

## 2019-11-22 NOTE — Telephone Encounter (Signed)
Called and spoke with patient.  Patient picked up prenatal vitamins at [facility] yesterday and will start taking those instead of picking up samples from our office.

## 2019-11-22 NOTE — Progress Notes (Signed)
Presents to Nurse Clinic with equivocal UPT results. Counseled regarding recommendation to repeat in 48 - 72 hours. Plans to schedule appt for UPT on 11/27/2019. Per client, home UPT was positive. Jossie Ng, RN

## 2019-11-27 ENCOUNTER — Ambulatory Visit (LOCAL_COMMUNITY_HEALTH_CENTER): Payer: Medicaid Other

## 2019-11-27 ENCOUNTER — Other Ambulatory Visit: Payer: Self-pay

## 2019-11-27 VITALS — BP 111/67 | Ht 63.0 in | Wt 190.0 lb

## 2019-11-27 DIAGNOSIS — Z3201 Encounter for pregnancy test, result positive: Secondary | ICD-10-CM

## 2019-11-27 LAB — PREGNANCY, URINE: Preg Test, Ur: POSITIVE — AB

## 2019-12-18 ENCOUNTER — Other Ambulatory Visit: Payer: Self-pay

## 2019-12-18 ENCOUNTER — Ambulatory Visit (INDEPENDENT_AMBULATORY_CARE_PROVIDER_SITE_OTHER): Payer: Medicaid Other | Admitting: Certified Nurse Midwife

## 2019-12-18 ENCOUNTER — Encounter: Payer: Medicaid Other | Admitting: Certified Nurse Midwife

## 2019-12-18 VITALS — BP 107/66 | HR 93 | Ht 63.0 in | Wt 196.7 lb

## 2019-12-18 DIAGNOSIS — R638 Other symptoms and signs concerning food and fluid intake: Secondary | ICD-10-CM

## 2019-12-18 DIAGNOSIS — Z113 Encounter for screening for infections with a predominantly sexual mode of transmission: Secondary | ICD-10-CM

## 2019-12-18 DIAGNOSIS — Z3401 Encounter for supervision of normal first pregnancy, first trimester: Secondary | ICD-10-CM

## 2019-12-18 DIAGNOSIS — Z0283 Encounter for blood-alcohol and blood-drug test: Secondary | ICD-10-CM

## 2019-12-18 LAB — OB RESULTS CONSOLE VARICELLA ZOSTER ANTIBODY, IGG: Varicella: NON-IMMUNE/NOT IMMUNE

## 2019-12-18 NOTE — Progress Notes (Signed)
      Gabrielle Byrd presents for NOB nurse intake visit. Pregnancy confirmation done at [facility], 11/27/19, with Richmond Campbell, RN.  G1.  P0.  LMP 10/21/19.  EDD 07/27/20.  Ga [redacted]w[redacted]d. Pregnancy education material explained and given.  0 cats in the home.  NOB labs ordered. BMI greater than 30. TSH/HbgA1c ordered. Sickle cell order due to race. HIV and drug screen explained and ordered. Genetic screening discussed. Genetic testing; Unsure. Pt to discuss genetic testing with provider. PNV encouraged. Pt to follow up with provider in 4 weeks for NOB physical and this week for U/S for dating and viability.

## 2019-12-18 NOTE — Patient Instructions (Signed)
WHAT OB PATIENTS CAN EXPECT   Confirmation of pregnancy and ultrasound ordered if medically indicated-[redacted] weeks gestation  New OB (NOB) intake with nurse and New OB (NOB) labs- [redacted] weeks gestation  New OB (NOB) physical examination with provider- 11/[redacted] weeks gestation  Flu vaccine-[redacted] weeks gestation  Anatomy scan-[redacted] weeks gestation  Glucose tolerance test, blood work to test for anemia, T-dap vaccine-[redacted] weeks gestation  Vaginal swabs/cultures-STD/Group B strep-[redacted] weeks gestation  Appointments every 4 weeks until 28 weeks  Every 2 weeks from 28 weeks until 36 weeks  Weekly visits from 36 weeks until delivery  Prenatal Care Prenatal care is health care during pregnancy. It helps you and your unborn baby (fetus) stay as healthy as possible. Prenatal care may be provided by a midwife, a family practice health care provider, or a childbirth and pregnancy specialist (obstetrician). How does this affect me? During pregnancy, you will be closely monitored for any new conditions that might develop. To lower your risk of pregnancy complications, you and your health care provider will talk about any underlying conditions you have. How does this affect my baby? Early and consistent prenatal care increases the chance that your baby will be healthy during pregnancy. Prenatal care lowers the risk that your baby will be:  Born early (prematurely).  Smaller than expected at birth (small for gestational age). What can I expect at the first prenatal care visit? Your first prenatal care visit will likely be the longest. You should schedule your first prenatal care visit as soon as you know that you are pregnant. Your first visit is a good time to talk about any questions or concerns you have about pregnancy. At your visit, you and your health care provider will talk about:  Your medical history, including: ? Any past pregnancies. ? Your family's medical history. ? The baby's father's medical history.  ? Any long-term (chronic) health conditions you have and how you manage them. ? Any surgeries or procedures you have had. ? Any current over-the-counter or prescription medicines, herbs, or supplements you are taking.  Other factors that could pose a risk to your baby, including:  Your home setting and your stress levels, including: ? Exposure to abuse or violence. ? Household financial strain. ? Mental health conditions you have.  Your daily health habits, including diet and exercise. Your health care provider will also:  Measure your weight, height, and blood pressure.  Do a physical exam, including a pelvic and breast exam.  Perform blood tests and urine tests to check for: ? Urinary tract infection. ? Sexually transmitted infections (STIs). ? Low iron levels in your blood (anemia). ? Blood type and certain proteins on red blood cells (Rh antibodies). ? Infections and immunity to viruses, such as hepatitis B and rubella. ? HIV (human immunodeficiency virus).  Do an ultrasound to confirm your baby's growth and development and to help predict your estimated due date (EDD). This ultrasound is done with a probe that is inserted into the vagina (transvaginal ultrasound).  Discuss your options for genetic screening.  Give you information about how to keep yourself and your baby healthy, including: ? Nutrition and taking vitamins. ? Physical activity. ? How to manage pregnancy symptoms such as nausea and vomiting (morning sickness). ? Infections and substances that may be harmful to your baby and how to avoid them. ? Food safety. ? Dental care. ? Working. ? Travel. ? Warning signs to watch for and when to call your health care provider. How often will  I have prenatal care visits? After your first prenatal care visit, you will have regular visits throughout your pregnancy. The visit schedule is often as follows:  Up to week 28 of pregnancy: once every 4 weeks.  28-36 weeks:  once every 2 weeks.  After 36 weeks: every week until delivery. Some women may have visits more or less often depending on any underlying health conditions and the health of the baby. Keep all follow-up and prenatal care visits as told by your health care provider. This is important. What happens during routine prenatal care visits? Your health care provider will:  Measure your weight and blood pressure.  Check for fetal heart sounds.  Measure the height of your uterus in your abdomen (fundal height). This may be measured starting around week 20 of pregnancy.  Check the position of your baby inside your uterus.  Ask questions about your diet, sleeping patterns, and whether you can feel the baby move.  Review warning signs to watch for and signs of labor.  Ask about any pregnancy symptoms you are having and how you are dealing with them. Symptoms may include: ? Headaches. ? Nausea and vomiting. ? Vaginal discharge. ? Swelling. ? Fatigue. ? Constipation. ? Any discomfort, including back or pelvic pain. Make a list of questions to ask your health care provider at your routine visits. What tests might I have during prenatal care visits? You may have blood, urine, and imaging tests throughout your pregnancy, such as:  Urine tests to check for glucose, protein, or signs of infection.  Glucose tests to check for a form of diabetes that can develop during pregnancy (gestational diabetes mellitus). This is usually done around week 24 of pregnancy.  An ultrasound to check your baby's growth and development and to check for birth defects. This is usually done around week 20 of pregnancy.  A test to check for group B strep (GBS) infection. This is usually done around week 36 of pregnancy.  Genetic testing. This may include blood or imaging tests, such as an ultrasound. Some genetic tests are done during the first trimester and some are done during the second trimester. What else can I  expect during prenatal care visits? Your health care provider may recommend getting certain vaccines during pregnancy. These may include:  A yearly flu shot (annual influenza vaccine). This is especially important if you will be pregnant during flu season.  Tdap (tetanus, diphtheria, pertussis) vaccine. Getting this vaccine during pregnancy can protect your baby from whooping cough (pertussis) after birth. This vaccine may be recommended between weeks 27 and 36 of pregnancy. Later in your pregnancy, your health care provider may give you information about:  Childbirth and breastfeeding classes.  Choosing a health care provider for your baby.  Umbilical cord banking.  Breastfeeding.  Birth control after your baby is born.  The hospital labor and delivery unit and how to tour it.  Registering at the hospital before you go into labor. Where to find more information  Office on Women's Health: LegalWarrants.gl  American Pregnancy Association: americanpregnancy.org  March of Dimes: marchofdimes.org Summary  Prenatal care helps you and your baby stay as healthy as possible during pregnancy.  Your first prenatal care visit will most likely be the longest.  You will have visits and tests throughout your pregnancy to monitor your health and your baby's health.  Bring a list of questions to your visits to ask your health care provider.  Make sure to keep all follow-up and prenatal  care visits with your health care provider. This information is not intended to replace advice given to you by your health care provider. Make sure you discuss any questions you have with your health care provider. Document Revised: 01/25/2019 Document Reviewed: 10/04/2017 Elsevier Patient Education  [place]. Morning Sickness  Morning sickness is when you feel sick to your stomach (nauseous) during pregnancy. You may feel sick to your stomach and throw up (vomit). You may feel sick in the  morning, but you can feel this way at any time of day. Some women feel very sick to their stomach and cannot stop throwing up (hyperemesis gravidarum). Follow these instructions at home: Medicines  Take over-the-counter and prescription medicines only as told by your doctor. Do not take any medicines until you talk with your doctor about them first.  Taking multivitamins before getting pregnant can stop or lessen the harshness of morning sickness. Eating and drinking  Eat dry toast or crackers before getting out of bed.  Eat 5 or 6 small meals a day.  Eat dry and bland foods like rice and baked potatoes.  Do not eat greasy, fatty, or spicy foods.  Have someone cook for you if the smell of food causes you to feel sick or throw up.  If you feel sick to your stomach after taking prenatal vitamins, take them at night or with a snack.  Eat protein when you need a snack. Nuts, yogurt, and cheese are good choices.  Drink fluids throughout the day.  Try ginger ale made with real ginger, ginger tea made from fresh grated ginger, or ginger candies. General instructions  Do not use any products that have nicotine or tobacco in them, such as cigarettes and e-cigarettes. If you need help quitting, ask your doctor.  Use an air purifier to keep the air in your house free of smells.  Get lots of fresh air.  Try to avoid smells that make you feel sick.  Try: ? Wearing a bracelet that is used for seasickness (acupressure wristband). ? Going to a doctor who puts thin needles into certain body points (acupuncture) to improve how you feel. Contact a doctor if:  You need medicine to feel better.  You feel dizzy or light-headed.  You are losing weight. Get help right away if:  You feel very sick to your stomach and cannot stop throwing up.  You pass out (faint).  You have very bad pain in your belly. Summary  Morning sickness is when you feel sick to your stomach (nauseous) during  pregnancy.  You may feel sick in the morning, but you can feel this way at any time of day.  Making some changes to what you eat may help your symptoms go away. This information is not intended to replace advice given to you by your health care provider. Make sure you discuss any questions you have with your health care provider. Document Revised: 09/17/2017 Document Reviewed: 11/05/2016 Elsevier Patient Education  2020 [employer]. How a Baby Grows During Pregnancy  Pregnancy begins when a female's sperm enters a female's egg (fertilization). Fertilization usually happens in one of the tubes (fallopian tubes) that connect the ovaries to the womb (uterus). The fertilized egg moves down the fallopian tube to the uterus. Once it reaches the uterus, it implants into the lining of the uterus and begins to grow. For the first 10 weeks, the fertilized egg is called an embryo. After 10 weeks, it is called a fetus. As the  fetus continues to grow, it receives oxygen and nutrients through tissue (placenta) that grows to support the developing baby. The placenta is the life support system for the baby. It provides oxygen and nutrition and removes waste. Learning as much as you can about your pregnancy and how your baby is developing can help you enjoy the experience. It can also make you aware of when there might be a problem and when to ask questions. How long does a typical pregnancy last? A pregnancy usually lasts 280 days, or about 40 weeks. Pregnancy is divided into three periods of growth, also called trimesters:  First trimester: 0-12 weeks.  Second trimester: 13-27 weeks.  Third trimester: 28-40 weeks. The day when your baby is ready to be born (full term) is your estimated date of delivery. How does my baby develop month by month? First month  The fertilized egg attaches to the inside of the uterus.  Some cells will form the placenta. Others will form the fetus.  The arms, legs, brain,  spinal cord, lungs, and heart begin to develop.  At the end of the first month, the heart begins to beat. Second month  The bones, inner ear, eyelids, hands, and feet form.  The genitals develop.  By the end of 8 weeks, all major organs are developing. Third month  All of the internal organs are forming.  Teeth develop below the gums.  Bones and muscles begin to grow. The spine can flex.  The skin is transparent.  Fingernails and toenails begin to form.  Arms and legs continue to grow longer, and hands and feet develop.  The fetus is about 3 inches (7.6 cm) long. Fourth month  The placenta is completely formed.  The external sex organs, neck, outer ear, eyebrows, eyelids, and fingernails are formed.  The fetus can hear, swallow, and move its arms and legs.  The kidneys begin to produce urine.  The skin is covered with a white, waxy coating (vernix) and very fine hair (lanugo). Fifth month  The fetus moves around more and can be felt for the first time (quickening).  The fetus starts to sleep and wake up and may begin to suck its finger.  The nails grow to the end of the fingers.  The organ in the digestive system that makes bile (gallbladder) functions and helps to digest nutrients.  If your baby is a girl, eggs are present in her ovaries. If your baby is a boy, testicles start to move down into his scrotum. Sixth month  The lungs are formed.  The eyes open. The brain continues to develop.  Your baby has fingerprints and toe prints. Your baby's hair grows thicker.  At the end of the second trimester, the fetus is about 9 inches (22.9 cm) long. Seventh month  The fetus kicks and stretches.  The eyes are developed enough to sense changes in light.  The hands can make a grasping motion.  The fetus responds to sound. Eighth month  All organs and body systems are fully developed and functioning.  Bones harden, and taste buds develop. The fetus may  hiccup.  Certain areas of the brain are still developing. The skull remains soft. Ninth month  The fetus gains about  lb (0.23 kg) each week.  The lungs are fully developed.  Patterns of sleep develop.  The fetus's head typically moves into a head-down position (vertex) in the uterus to prepare for birth.  The fetus weighs 6-9 lb (2.72-4.08 kg) and is  19-20 inches (48.26-50.8 cm) long. What can I do to have a healthy pregnancy and help my baby develop? General instructions  Take prenatal vitamins as directed by your health care provider. These include vitamins such as folic acid, iron, calcium, and vitamin D. They are important for healthy development.  Take medicines only as directed by your health care provider. Read labels and ask a pharmacist or your health care provider whether over-the-counter medicines, supplements, and prescription drugs are safe to take during pregnancy.  Keep all follow-up visits as directed by your health care provider. This is important. Follow-up visits include prenatal care and screening tests. How do I know if my baby is developing well? At each prenatal visit, your health care provider will do several different tests to check on your health and keep track of your baby's development. These include:  Fundal height and position. ? Your health care provider will measure your growing belly from your pubic bone to the top of the uterus using a tape measure. ? Your health care provider will also feel your belly to determine your baby's position.  Heartbeat. ? An ultrasound in the first trimester can confirm pregnancy and show a heartbeat, depending on how far along you are. ? Your health care provider will check your baby's heart rate at every prenatal visit.  Second trimester ultrasound. ? This ultrasound checks your baby's development. It also may show your baby's gender. What should I do if I have concerns about my baby's development? Always talk with  your health care provider about any concerns that you may have about your pregnancy and your baby. Summary  A pregnancy usually lasts 280 days, or about 40 weeks. Pregnancy is divided into three periods of growth, also called trimesters.  Your health care provider will monitor your baby's growth and development throughout your pregnancy.  Follow your health care provider's recommendations about taking prenatal vitamins and medicines during your pregnancy.  Talk with your health care provider if you have any concerns about your pregnancy or your developing baby. This information is not intended to replace advice given to you by your health care provider. Make sure you discuss any questions you have with your health care provider. Document Revised: 01/26/2019 Document Reviewed: 08/18/2017 Elsevier Patient Education  2020 [place] of Pregnancy  The first trimester of pregnancy is from week 1 until the end of week 13 (months 1 through 3). During this time, your baby will begin to develop inside you. At 6-8 weeks, the eyes and face are formed, and the heartbeat can be seen on ultrasound. At the end of 12 weeks, all the baby's organs are formed. Prenatal care is all the medical care you receive before the birth of your baby. Make sure you get good prenatal care and follow all of your doctor's instructions. Follow these instructions at home: Medicines  Take over-the-counter and prescription medicines only as told by your doctor. Some medicines are safe and some medicines are not safe during pregnancy.  Take a prenatal vitamin that contains at least 600 micrograms (mcg) of folic acid.  If you have trouble pooping (constipation), take medicine that will make your stool soft (stool softener) if your doctor approves. Eating and drinking   Eat regular, healthy meals.  Your doctor will tell you the amount of weight gain that is right for you.  Avoid raw meat and uncooked  cheese.  If you feel sick to your stomach (nauseous) or throw up (vomit): ? Eat  4 or 5 small meals a day instead of 3 large meals. ? Try eating a few soda crackers. ? Drink liquids between meals instead of during meals.  To prevent constipation: ? Eat foods that are high in fiber, like fresh fruits and vegetables, whole grains, and beans. ? Drink enough fluids to keep your pee (urine) clear or pale yellow. Activity  Exercise only as told by your doctor. Stop exercising if you have cramps or pain in your lower belly (abdomen) or low back.  Do not exercise if it is too hot, too humid, or if you are in a place of great height (high altitude).  Try to avoid standing for long periods of time. Move your legs often if you must stand in one place for a long time.  Avoid heavy lifting.  Wear low-heeled shoes. Sit and stand up straight.  You can have sex unless your doctor tells you not to. Relieving pain and discomfort  Wear a good support bra if your breasts are sore.  Take warm water baths (sitz baths) to soothe pain or discomfort caused by hemorrhoids. Use hemorrhoid cream if your doctor says it is okay.  Rest with your legs raised if you have leg cramps or low back pain.  If you have puffy, bulging veins (varicose veins) in your legs: ? Wear support hose or compression stockings as told by your doctor. ? Raise (elevate) your feet for 15 minutes, 3-4 times a day. ? Limit salt in your food. Prenatal care  Schedule your prenatal visits by the twelfth week of pregnancy.  Write down your questions. Take them to your prenatal visits.  Keep all your prenatal visits as told by your doctor. This is important. Safety  Wear your seat belt at all times when driving.  Make a list of emergency phone numbers. The list should include numbers for family, friends, the hospital, and police and fire departments. General instructions  Ask your doctor for a referral to a local prenatal class.  Begin classes no later than at the start of month 6 of your pregnancy.  Ask for help if you need counseling or if you need help with nutrition. Your doctor can give you advice or tell you where to go for help.  Do not use hot tubs, steam rooms, or saunas.  Do not douche or use tampons or scented sanitary pads.  Do not cross your legs for long periods of time.  Avoid all herbs and alcohol. Avoid drugs that are not approved by your doctor.  Do not use any tobacco products, including cigarettes, chewing tobacco, and electronic cigarettes. If you need help quitting, ask your doctor. You may get counseling or other support to help you quit.  Avoid cat litter boxes and soil used by cats. These carry germs that can cause birth defects in the baby and can cause a loss of your baby (miscarriage) or stillbirth.  Visit your dentist. At home, brush your teeth with a soft toothbrush. Be gentle when you floss. Contact a doctor if:  You are dizzy.  You have mild cramps or pressure in your lower belly.  You have a nagging pain in your belly area.  You continue to feel sick to your stomach, you throw up, or you have watery poop (diarrhea).  You have a bad smelling fluid coming from your vagina.  You have pain when you pee (urinate).  You have increased puffiness (swelling) in your face, hands, legs, or ankles. Get help right away  if:  You have a fever.  You are leaking fluid from your vagina.  You have spotting or bleeding from your vagina.  You have very bad belly cramping or pain.  You gain or lose weight rapidly.  You throw up blood. It may look like coffee grounds.  You are around people who have [place] measles, fifth disease, or chickenpox.  You have a very bad headache.  You have shortness of breath.  You have any kind of trauma, such as from a fall or a car accident. Summary  The first trimester of pregnancy is from week 1 until the end of week 13 (months 1 through 3).   To take care of yourself and your unborn baby, you will need to eat healthy meals, take medicines only if your doctor tells you to do so, and do activities that are safe for you and your baby.  Keep all follow-up visits as told by your doctor. This is important as your doctor will have to ensure that your baby is healthy and growing well. This information is not intended to replace advice given to you by your health care provider. Make sure you discuss any questions you have with your health care provider. Document Revised: 01/26/2019 Document Reviewed: 10/13/2016 Elsevier Patient Education  2020 [employer]. Commonly Asked Questions During Pregnancy  Cats: A parasite can be excreted in cat feces.  To avoid exposure you need to have another person empty the little box.  If you must empty the litter box you will need to wear gloves.  Wash your hands after handling your cat.  This parasite can also be found in raw or undercooked meat so this should also be avoided.  Colds, Sore Throats, Flu: Please check your medication sheet to see what you can take for symptoms.  If your symptoms are unrelieved by these medications please call the office.  Dental Work: Most any dental work [employer] recommends is permitted.  X-rays should only be taken during the first trimester if absolutely necessary.  Your abdomen should be shielded with a lead apron during all x-rays.  Please notify your provider prior to receiving any x-rays.  Novocaine is fine; gas is not recommended.  If your dentist requires a note from [place] prior to dental work please call the office and we will provide one for you.  Exercise: Exercise is an important part of staying healthy during your pregnancy.  You may continue most exercises you were accustomed to prior to pregnancy.  Later in your pregnancy you will most likely notice you have difficulty with activities requiring balance like riding a bicycle.  It is important that you listen to your  body and avoid activities that put you at a higher risk of falling.  Adequate rest and staying well hydrated are a must!  If you have questions about the safety of specific activities ask your provider.    Exposure to Children with illness: Try to avoid obvious exposure; report any symptoms to [place] when noted,  If you have chicken pos, red measles or mumps, you should be immune to these diseases.   Please do not take any vaccines while pregnant unless you have checked with your OB provider.  Fetal Movement: After 28 weeks we recommend you do "kick counts" twice daily.  Lie or sit down in a calm quiet environment and count your baby movements "kicks".  You should feel your baby at least 10 times per hour.  If you have not felt  10 kicks within the first hour get up, walk around and have something sweet to eat or drink then repeat for an additional hour.  If count remains less than 10 per hour notify your provider.  Fumigating: Follow your pest control agent's advice as to how long to stay out of your home.  Ventilate the area well before re-entering.  Hemorrhoids:   Most over-the-counter preparations can be used during pregnancy.  Check your medication to see what is safe to use.  It is important to use a stool softener or fiber in your diet and to drink lots of liquids.  If hemorrhoids seem to be getting worse please call the office.   Hot Tubs:  Hot tubs Jacuzzis and saunas are not recommended while pregnant.  These increase your internal body temperature and should be avoided.  Intercourse:  Sexual intercourse is safe during pregnancy as long as you are comfortable, unless otherwise advised by your provider.  Spotting may occur after intercourse; report any bright red bleeding that is heavier than spotting.  Labor:  If you know that you are in labor, please go to the hospital.  If you are unsure, please call the office and let us help you decide what to do.  Lifting, straining, etc:  If your job  requires heavy lifting or straining please check with your provider for any limitations.  Generally, you should not lift items heavier than that you can lift simply with your hands and arms (no back muscles)  Painting:  Paint fumes do not harm your pregnancy, but may make you ill and should be avoided if possible.  Latex or water based paints have less odor than oils.  Use adequate ventilation while painting.  Permanents & Hair Color:  Chemicals in hair dyes are not recommended as they cause increase hair dryness which can increase hair loss during pregnancy.  " Highlighting" and permanents are allowed.  Dye may be absorbed differently and permanents may not hold as well during pregnancy.  Sunbathing:  Use a sunscreen, as skin burns easily during pregnancy.  Drink plenty of fluids; avoid over heating.  Tanning Beds:  Because their possible side effects are still unknown, tanning beds are not recommended.  Ultrasound Scans:  Routine ultrasounds are performed at approximately 20 weeks.  You will be able to see your baby's general anatomy an if you would like to know the gender this can usually be determined as well.  If it is questionable when you conceived you may also receive an ultrasound early in your pregnancy for dating purposes.  Otherwise ultrasound exams are not routinely performed unless there is a medical necessity.  Although you can request a scan we ask that you pay for it when conducted because insurance does not cover " patient request" scans.  Work: If your pregnancy proceeds without complications you may work until your due date, unless your physician or employer advises otherwise.  Round Ligament Pain/Pelvic Discomfort:  Sharp, shooting pains not associated with bleeding are fairly common, usually occurring in the second trimester of pregnancy.  They tend to be worse when standing up or when you remain standing for long periods of time.  These are the result of pressure of certain  pelvic ligaments called "round ligaments".  Rest, Tylenol and heat seem to be the most effective relief.  As the womb and fetus grow, they rise out of the pelvis and the discomfort improves.  Please notify the office if your pain seems different than that  described.  It may represent a more serious condition.   Common Medications Safe in Pregnancy  Acne:      Constipation:  Benzoyl Peroxide     Colace  Clindamycin      Dulcolax Suppository  Topica Erythromycin     Fibercon  Salicylic Acid      Metamucil         Miralax AVOID:        Senakot   Accutane    Cough:  Retin-A       Cough Drops  Tetracycline      Phenergan w/ Codeine if Rx  Minocycline      Robitussin (Plain & DM)  Antibiotics:     Crabs/Lice:  Ceclor       RID  Cephalosporins    AVOID:  E-Mycins      Kwell  Keflex  Macrobid/Macrodantin   Diarrhea:  Penicillin      Kao-Pectate  Zithromax      Imodium AD         PUSH FLUIDS AVOID:       Cipro     Fever:  Tetracycline      Tylenol (Regular or Extra  Minocycline       Strength)  Levaquin      Extra Strength-Do not          Exceed 8 tabs/24 hrs Caffeine:        '200mg'$ /day (equiv. To 1 cup of coffee or  approx. 3 12 oz sodas)         Gas: Cold/Hayfever:       Gas-X  Benadryl      Mylicon  Claritin       Phazyme  **Claritin-D        Chlor-Trimeton    Headaches:  Dimetapp      ASA-Free Excedrin  Drixoral-Non-Drowsy     Cold Compress  Mucinex (Guaifenasin)     Tylenol (Regular or Extra  Sudafed/Sudafed-12 Hour     Strength)  **Sudafed PE Pseudoephedrine   Tylenol Cold & Sinus     Vicks Vapor Rub  Zyrtec  **AVOID if Problems With Blood Pressure         Heartburn: Avoid lying down for at least 1 hour after meals  Aciphex      Maalox     Rash:  Milk of Magnesia     Benadryl    Mylanta       1% Hydrocortisone Cream  Pepcid  Pepcid Complete   Sleep Aids:  Prevacid      Ambien   Prilosec       Benadryl  Rolaids       Chamomile Tea  Tums (Limit 4/day)      Unisom         Tylenol PM         Warm milk-add vanilla or  Hemorrhoids:       Sugar for taste  Anusol/Anusol H.C.  (RX: Analapram 2.5%)  Sugar Substitutes:  Hydrocortisone OTC     Ok in moderation  Preparation H      Tucks        Vaseline lotion applied to tissue with wiping    Herpes:     Throat:  Acyclovir      Oragel  Famvir  Valtrex     Vaccines:         Flu Shot Leg Cramps:       *Gardasil  Benadryl      Hepatitis A  Hepatitis B Nasal Spray:       Pneumovax  Saline Nasal Spray     Polio Booster         Tetanus Nausea:       Tuberculosis test or PPD  Vitamin B6 25 mg TID   AVOID:    Dramamine      *Gardasil  Emetrol       Live Poliovirus  Ginger Root 250 mg QID    MMR (measles, mumps &  High Complex Carbs @ Bedtime    rebella)  Sea Bands-Accupressure    Varicella (Chickenpox)  Unisom 1/2 tab TID     *No known complications           If received before Pain:         Known pregnancy;   Darvocet       Resume series after  Lortab        Delivery  Percocet    Yeast:   Tramadol      Femstat  Tylenol 3      Gyne-lotrimin  Ultram       Monistat  Vicodin           MISC:         All Sunscreens           Hair Coloring/highlights          Insect Repellant's          (Including DEET)         Mystic Tans  

## 2019-12-18 NOTE — Progress Notes (Signed)
I have reviewed the record and concur with patient management and plan of care.    Gunnar Bulla, CNM [facility], [facility] 12/18/19 1:00 PM

## 2019-12-19 LAB — NICOTINE SCREEN, URINE: Cotinine Ql Scrn, Ur: NEGATIVE ng/mL

## 2019-12-19 LAB — HIV ANTIBODY (ROUTINE TESTING W REFLEX): HIV Screen 4th Generation wRfx: NONREACTIVE

## 2019-12-19 LAB — ABO AND RH: Rh Factor: POSITIVE

## 2019-12-19 LAB — DRUG PROFILE, UR, 9 DRUGS (LABCORP)
Amphetamines, Urine: NEGATIVE ng/mL
Barbiturate Quant, Ur: NEGATIVE ng/mL
Benzodiazepine Quant, Ur: NEGATIVE ng/mL
Cannabinoid Quant, Ur: NEGATIVE ng/mL
Cocaine (Metab.): NEGATIVE ng/mL
Methadone Screen, Urine: NEGATIVE ng/mL
Opiate Quant, Ur: NEGATIVE ng/mL
PCP Quant, Ur: NEGATIVE ng/mL
Propoxyphene: NEGATIVE ng/mL

## 2019-12-19 LAB — URINALYSIS, ROUTINE W REFLEX MICROSCOPIC
Bilirubin, UA: NEGATIVE
Glucose, UA: NEGATIVE
Ketones, UA: NEGATIVE
Leukocytes,UA: NEGATIVE
Nitrite, UA: NEGATIVE
Protein,UA: NEGATIVE
RBC, UA: NEGATIVE
Specific Gravity, UA: 1.015 (ref 1.005–1.030)
Urobilinogen, Ur: 0.2 mg/dL (ref 0.2–1.0)
pH, UA: 7.5 (ref 5.0–7.5)

## 2019-12-19 LAB — HEPATITIS B SURFACE ANTIGEN: Hepatitis B Surface Ag: NEGATIVE

## 2019-12-19 LAB — RPR: RPR Ser Ql: NONREACTIVE

## 2019-12-19 LAB — ANTIBODY SCREEN: Antibody Screen: NEGATIVE

## 2019-12-19 LAB — TSH: TSH: 0.754 u[IU]/mL (ref 0.450–4.500)

## 2019-12-19 LAB — HGB SOLU + RFLX FRAC: Sickle Solubility Test - HGBRFX: NEGATIVE

## 2019-12-19 LAB — RUBELLA SCREEN: Rubella Antibodies, IGG: 6.76 index (ref >0.99)

## 2019-12-19 LAB — TOXOPLASMA ANTIBODIES- IGG AND  IGM
Toxoplasma Antibody- IgM: <3 AU/mL (ref 0.0–7.9)
Toxoplasma IgG Ratio: <3 IU/mL (ref 0.0–7.1)

## 2019-12-19 LAB — HEMOGLOBIN A1C
Est. average glucose Bld gHb Est-mCnc: 105 mg/dL
Hgb A1c MFr Bld: 5.3 % (ref 4.8–5.6)

## 2019-12-19 LAB — VARICELLA ZOSTER ANTIBODY, IGG: Varicella zoster IgG: <135 index — ABNORMAL LOW (ref >165)

## 2019-12-20 ENCOUNTER — Other Ambulatory Visit: Payer: Medicaid Other

## 2019-12-20 LAB — URINE CULTURE, OB REFLEX: Organism ID, Bacteria: NO GROWTH

## 2019-12-20 LAB — GC/CHLAMYDIA PROBE AMP
Chlamydia trachomatis, NAA: NEGATIVE
Neisseria Gonorrhoeae by PCR: NEGATIVE

## 2019-12-20 LAB — CULTURE, OB URINE

## 2019-12-21 ENCOUNTER — Other Ambulatory Visit: Payer: Self-pay

## 2019-12-21 ENCOUNTER — Ambulatory Visit (INDEPENDENT_AMBULATORY_CARE_PROVIDER_SITE_OTHER): Payer: Medicaid Other

## 2019-12-21 ENCOUNTER — Encounter: Payer: Self-pay | Admitting: Certified Nurse Midwife

## 2019-12-21 DIAGNOSIS — Z3A08 8 weeks gestation of pregnancy: Secondary | ICD-10-CM | POA: Diagnosis not present

## 2019-12-21 DIAGNOSIS — Z3401 Encounter for supervision of normal first pregnancy, first trimester: Secondary | ICD-10-CM

## 2019-12-21 DIAGNOSIS — Z2839 Other underimmunization status: Secondary | ICD-10-CM | POA: Insufficient documentation

## 2019-12-21 DIAGNOSIS — Z3689 Encounter for other specified antenatal screening: Secondary | ICD-10-CM

## 2019-12-21 DIAGNOSIS — O09899 Supervision of other high risk pregnancies, unspecified trimester: Secondary | ICD-10-CM | POA: Insufficient documentation

## 2020-01-11 ENCOUNTER — Encounter: Payer: Self-pay | Admitting: Certified Nurse Midwife

## 2020-01-11 ENCOUNTER — Other Ambulatory Visit: Payer: Medicaid Other

## 2020-01-11 ENCOUNTER — Ambulatory Visit (INDEPENDENT_AMBULATORY_CARE_PROVIDER_SITE_OTHER): Payer: Medicaid Other | Admitting: Certified Nurse Midwife

## 2020-01-11 ENCOUNTER — Other Ambulatory Visit: Payer: Self-pay

## 2020-01-11 ENCOUNTER — Other Ambulatory Visit (HOSPITAL_COMMUNITY)
Admission: RE | Admit: 2020-01-11 | Discharge: 2020-01-11 | Disposition: A | Discharge status: Home / Self Care | Payer: Medicaid Other | Source: Ambulatory Visit | Attending: Certified Nurse Midwife | Admitting: Certified Nurse Midwife

## 2020-01-11 VITALS — BP 102/68 | HR 95 | Wt 199.4 lb

## 2020-01-11 DIAGNOSIS — O09899 Supervision of other high risk pregnancies, unspecified trimester: Secondary | ICD-10-CM

## 2020-01-11 DIAGNOSIS — Z283 Underimmunization status: Secondary | ICD-10-CM

## 2020-01-11 DIAGNOSIS — Z2839 Other underimmunization status: Secondary | ICD-10-CM

## 2020-01-11 DIAGNOSIS — Z124 Encounter for screening for malignant neoplasm of cervix: Secondary | ICD-10-CM

## 2020-01-11 DIAGNOSIS — O99210 Obesity complicating pregnancy, unspecified trimester: Secondary | ICD-10-CM

## 2020-01-11 DIAGNOSIS — O99211 Obesity complicating pregnancy, first trimester: Secondary | ICD-10-CM

## 2020-01-11 DIAGNOSIS — Z3A11 11 weeks gestation of pregnancy: Secondary | ICD-10-CM | POA: Diagnosis not present

## 2020-01-11 DIAGNOSIS — Z3401 Encounter for supervision of normal first pregnancy, first trimester: Secondary | ICD-10-CM | POA: Insufficient documentation

## 2020-01-11 LAB — POCT URINALYSIS DIPSTICK OB
Bilirubin, UA: NEGATIVE
Blood, UA: NEGATIVE
Glucose, UA: NEGATIVE
Ketones, UA: NEGATIVE
Leukocytes, UA: NEGATIVE
Nitrite, UA: NEGATIVE
POC,PROTEIN,UA: NEGATIVE
Spec Grav, UA: 1.025 (ref 1.010–1.025)
Urobilinogen, UA: 0.2 E.U./dL
pH, UA: 5 (ref 5.0–8.0)

## 2020-01-11 NOTE — Patient Instructions (Signed)
WHAT OB PATIENTS CAN EXPECT   Confirmation of pregnancy and ultrasound ordered if medically indicated-[redacted] weeks gestation  New OB (NOB) intake with nurse and New OB (NOB) labs- [redacted] weeks gestation  New OB (NOB) physical examination with provider- 11/[redacted] weeks gestation  Flu vaccine-[redacted] weeks gestation  Anatomy scan-[redacted] weeks gestation  Glucose tolerance test, blood work to test for anemia, T-dap vaccine-[redacted] weeks gestation  Vaginal swabs/cultures-STD/Group B strep-[redacted] weeks gestation  Appointments every 4 weeks until 28 weeks  Every 2 weeks from 28 weeks until 36 weeks  Weekly visits from 36 weeks until delivery    Back Pain in Pregnancy Back pain during pregnancy is common. Back pain may be caused by several factors that are related to changes during your pregnancy. Follow these instructions at home: Managing pain, stiffness, and swelling      If directed, for sudden (acute) back pain, put ice on the painful area. ? Put ice in a plastic bag. ? Place a towel between your skin and the bag. ? Leave the ice on for 20 minutes, 2-3 times per day.  If directed, apply heat to the affected area before you exercise. Use the heat source that your health care provider recommends, such as a moist heat pack or a heating pad. ? Place a towel between your skin and the heat source. ? Leave the heat on for 20-30 minutes. ? Remove the heat if your skin turns bright red. This is especially important if you are unable to feel pain, heat, or cold. You may have a greater risk of getting burned.  If directed, massage the affected area. Activity  Exercise as told by your health care provider. Gentle exercise is the best way to prevent or manage back pain.  Listen to your body when lifting. If lifting hurts, ask for help or bend your knees. This uses your leg muscles instead of your back muscles.  Squat down when picking up something from the floor. Do not bend over.  Only use bed rest for short  periods as told by your health care provider. Bed rest should only be used for the most severe episodes of back pain. Standing, sitting, and lying down  Do not stand in one place for long periods of time.  Use good posture when sitting. Make sure your head rests over your shoulders and is not hanging forward. Use a pillow on your lower back if necessary.  Try sleeping on your side, preferably the left side, with a pregnancy support pillow or 1-2 regular pillows between your legs. ? If you have back pain after a night's rest, your bed may be too soft. ? A firm mattress may provide more support for your back during pregnancy. General instructions  Do not wear high heels.  Eat a healthy diet. Try to gain weight within your health care provider's recommendations.  Use a maternity girdle, elastic sling, or back brace as told by your health care provider.  Take over-the-counter and prescription medicines only as told by your health care provider.  Work with a physical therapist or massage therapist to find ways to manage back pain. Acupuncture or massage therapy may be helpful.  Keep all follow-up visits as told by your health care provider. This is important. Contact a health care provider if:  Your back pain interferes with your daily activities.  You have increasing pain in other parts of your body. Get help right away if:  You develop numbness, tingling, weakness, or problems with the  use of your arms or legs.  You develop severe back pain that is not controlled with medicine.  You have a change in bowel or bladder control.  You develop shortness of breath, dizziness, or you faint.  You develop nausea, vomiting, or sweating.  You have back pain that is a rhythmic, cramping pain similar to labor pains. Labor pain is usually 1-2 minutes apart, lasts for about 1 minute, and involves a bearing down feeling or pressure in your pelvis.  You have back pain and your water breaks or  you have vaginal bleeding.  You have back pain or numbness that travels down your leg.  Your back pain developed after you fell.  You develop pain on one side of your back.  You see blood in your urine.  You develop skin blisters in the area of your back pain. Summary  Back pain may be caused by several factors that are related to changes during your pregnancy.  Follow instructions as told by your health care provider for managing pain, stiffness, and swelling.  Exercise as told by your health care provider. Gentle exercise is the best way to prevent or manage back pain.  Take over-the-counter and prescription medicines only as told by your health care provider.  Keep all follow-up visits as told by your health care provider. This is important. This information is not intended to replace advice given to you by your health care provider. Make sure you discuss any questions you have with your health care provider. Document Revised: 01/24/2019 Document Reviewed: 03/23/2018 Elsevier Patient Education  [place] of Pregnancy  The second trimester is from week 14 through week 27 (month 4 through 6). This is often the time in pregnancy that you feel your best. Often times, morning sickness has lessened or quit. You may have more energy, and you may get hungry more often. Your unborn baby is growing rapidly. At the end of the sixth month, he or she is about 9 inches long and weighs about 1 pounds. You will likely feel the baby move between 18 and 20 weeks of pregnancy. Follow these instructions at home: Medicines  Take over-the-counter and prescription medicines only as told by your doctor. Some medicines are safe and some medicines are not safe during pregnancy.  Take a prenatal vitamin that contains at least 600 micrograms (mcg) of folic acid.  If you have trouble pooping (constipation), take medicine that will make your stool soft (stool softener) if your  doctor approves. Eating and drinking   Eat regular, healthy meals.  Avoid raw meat and uncooked cheese.  If you get low calcium from the food you eat, talk to your doctor about taking a daily calcium supplement.  Avoid foods that are high in fat and sugars, such as fried and sweet foods.  If you feel sick to your stomach (nauseous) or throw up (vomit): ? Eat 4 or 5 small meals a day instead of 3 large meals. ? Try eating a few soda crackers. ? Drink liquids between meals instead of during meals.  To prevent constipation: ? Eat foods that are high in fiber, like fresh fruits and vegetables, whole grains, and beans. ? Drink enough fluids to keep your pee (urine) clear or pale yellow. Activity  Exercise only as told by your doctor. Stop exercising if you start to have cramps.  Do not exercise if it is too hot, too humid, or if you are in a place of great  height (high altitude).  Avoid heavy lifting.  Wear low-heeled shoes. Sit and stand up straight.  You can continue to have sex unless your doctor tells you not to. Relieving pain and discomfort  Wear a good support bra if your breasts are tender.  Take warm water baths (sitz baths) to soothe pain or discomfort caused by hemorrhoids. Use hemorrhoid cream if your doctor approves.  Rest with your legs raised if you have leg cramps or low back pain.  If you develop puffy, bulging veins (varicose veins) in your legs: ? Wear support hose or compression stockings as told by your doctor. ? Raise (elevate) your feet for 15 minutes, 3-4 times a day. ? Limit salt in your food. Prenatal care  Write down your questions. Take them to your prenatal visits.  Keep all your prenatal visits as told by your doctor. This is important. Safety  Wear your seat belt when driving.  Make a list of emergency phone numbers, including numbers for family, friends, the hospital, and police and fire departments. General instructions  Ask your  doctor about the right foods to eat or for help finding a counselor, if you need these services.  Ask your doctor about local prenatal classes. Begin classes before month 6 of your pregnancy.  Do not use hot tubs, steam rooms, or saunas.  Do not douche or use tampons or scented sanitary pads.  Do not cross your legs for long periods of time.  Visit your dentist if you have not done so. Use a soft toothbrush to brush your teeth. Floss gently.  Avoid all smoking, herbs, and alcohol. Avoid drugs that are not approved by your doctor.  Do not use any products that contain nicotine or tobacco, such as cigarettes and e-cigarettes. If you need help quitting, ask your doctor.  Avoid cat litter boxes and soil used by cats. These carry germs that can cause birth defects in the baby and can cause a loss of your baby (miscarriage) or stillbirth. Contact a doctor if:  You have mild cramps or pressure in your lower belly.  You have pain when you pee (urinate).  You have bad smelling fluid coming from your vagina.  You continue to feel sick to your stomach (nauseous), throw up (vomit), or have watery poop (diarrhea).  You have a nagging pain in your belly area.  You feel dizzy. Get help right away if:  You have a fever.  You are leaking fluid from your vagina.  You have spotting or bleeding from your vagina.  You have severe belly cramping or pain.  You lose or gain weight rapidly.  You have trouble catching your breath and have chest pain.  You notice sudden or extreme puffiness (swelling) of your face, hands, ankles, feet, or legs.  You have not felt the baby move in over an hour.  You have severe headaches that do not go away when you take medicine.  You have trouble seeing. Summary  The second trimester is from week 14 through week 27 (months 4 through 6). This is often the time in pregnancy that you feel your best.  To take care of yourself and your unborn baby, you will  need to eat healthy meals, take medicines only if your doctor tells you to do so, and do activities that are safe for you and your baby.  Call your doctor if you get sick or if you notice anything unusual about your pregnancy. Also, call your doctor if you need  help with the right food to eat, or if you want to know what activities are safe for you. This information is not intended to replace advice given to you by your health care provider. Make sure you discuss any questions you have with your health care provider. Document Revised: 01/27/2019 Document Reviewed: 11/10/2016 Elsevier Patient Education  [place] Weight Gain During Pregnancy, Adult A certain amount of weight gain during pregnancy is normal and healthy. How much weight you should gain depends on your overall health and a measurement called BMI (body mass index). BMI is an estimate of your body fat based on your height and weight. You can use an Freight forwarder to figure out your BMI, or you can ask your health care provider to calculate it for you at your next visit. Your recommended pregnancy weight gain is based on your pre-pregnancy BMI. General guidelines for a healthy total weight gain during pregnancy are listed below. If your BMI at or before the start of your pregnancy is:  Less than 18.5 (underweight), you should gain 28-40 lb (13-18 kg).  18.5-24.9 (normal weight), you should gain 25-35 lb (11-16 kg).  25-29.9 (overweight), you should gain 15-25 lb (7-11 kg).  30 or higher (obese), you should gain 11-20 lb (5-9 kg). These ranges vary depending on your individual health. If you are carrying more than one baby (multiples), it may be safe to gain more weight than these recommendations. If you gain less weight than recommended, that may be safe as long as your baby is growing and developing normally. How can unhealthy weight gain affect me and my baby? Gaining too much weight during pregnancy can lead  to pregnancy complications, such as:  A temporary form of diabetes that develops during pregnancy (gestational diabetes).  High blood pressure during pregnancy and protein in your urine (preeclampsia).  High blood pressure during pregnancy without protein in your urine (gestational hypertension).  Your baby having a high weight at birth, which may: ? Raise your risk of having a more difficult delivery or a surgical delivery (cesarean delivery, or C-section). ? Raise your child's risk of developing obesity during childhood. Not gaining enough weight can be life-threatening for your baby, and it may raise your baby's chances of:  Being born early (preterm).  Growing more slowly than normal during pregnancy (growth restriction).  Having a low weight at birth. What actions can I take to gain a healthy amount of weight during pregnancy? General instructions  Keep track of your weight gain during pregnancy.  Take over-the-counter and prescription medicines only as told by your health care provider. Take all prenatal supplements as directed.  Keep all health care visits during pregnancy (prenatal visits). These visits are a good time to discuss your weight gain. Your health care provider will weigh you at each visit to make sure you are gaining a healthy amount of weight. Nutrition   Eat a balanced, nutrient-rich diet. Eat plenty of: ? Fruits and vegetables, such as berries and broccoli. ? Whole grains, such as millet, barley, whole-wheat breads and cereals, and oatmeal. ? Low-fat dairy products or non-dairy products such as almond milk or rice milk. ? Protein foods, such as lean meat, chicken, eggs, and legumes (such as peas, beans, soybeans, and lentils).  Avoid foods that are fried or have a lot of fat, salt (sodium), or sugar.  Drink enough fluid to keep your urine pale yellow.  Choose healthy snack and drink options when you are  at work or on the go: ? Drink water. Avoid soda,  sports drinks, and juices that have added sugar. ? Avoid drinks with caffeine, such as coffee and energy drinks. ? Eat snacks that are high in protein, such as nuts, protein bars, and low-fat yogurt. ? Carry convenient snacks in your purse that do not need refrigeration, such as a pack of trail mix, an apple, or a granola bar.  If you need help improving your diet, work with a health care provider or a diet and nutrition specialist (dietitian). Activity   Exercise regularly, as told by your health care provider. ? If you were active before becoming pregnant, you may be able to continue your regular fitness activities. ? If you were not active before pregnancy, you may gradually build up to exercising for 30 or more minutes on most days of the week. This may include walking, swimming, or yoga.  Ask your health care provider what activities are safe for you. Talk with your health care provider about whether you may need to be excused from certain school or work activities. Where to find more information Learn more about managing your weight gain during pregnancy from:  American Pregnancy Association: www.americanpregnancy.org  [place] Department of Agriculture pregnancy weight gain calculator: [employer] Summary  Too much weight gain during pregnancy can lead to complications for you and your baby.  Find out your pre-pregnancy BMI to determine how much weight gain is healthy for you.  Eat nutritious foods and stay active.  Keep all of your prenatal visits as told by your health care provider. This information is not intended to replace advice given to you by your health care provider. Make sure you discuss any questions you have with your health care provider. Document Revised: 06/28/2019 Document Reviewed: 06/25/2017 Elsevier Patient Education  [place].   Exercise During Pregnancy Exercise is an important part of being healthy for people of all ages. Exercise  improves the function of your heart and lungs and helps you maintain strength, flexibility, and a healthy body weight. Exercise also boosts energy levels and elevates mood. Most women should exercise regularly during pregnancy. In rare cases, women with certain medical conditions or complications may be asked to limit or avoid exercise during pregnancy. How does this affect me? Along with maintaining general strength and flexibility, exercising during pregnancy can help:  Keep strength in muscles that are used during labor and childbirth.  Decrease low back pain.  Reduce symptoms of depression.  Control weight gain during pregnancy.  Reduce the risk of needing insulin if you develop diabetes during pregnancy.  Decrease the risk of cesarean delivery.  Speed up your recovery after giving birth. How does this affect my baby? Exercise can help you have a healthy pregnancy. Exercise does not cause premature birth. It will not cause your baby to weigh less at birth. What exercises can I do? Many exercises are safe for you to do during pregnancy. Do a variety of exercises that safely increase your heart and breathing rates and help you build and maintain muscle strength. Do exercises exactly as told by your health care provider. You may do these exercises:  Walking or hiking.  Swimming.  Water aerobics.  Riding a stationary bike.  Strength training.  Modified yoga or Pilates. Tell your instructor that you are pregnant. Avoid overstretching, and avoid lying on your back for long periods of time.  Running or jogging. Only choose this type of exercise if you: ? Ran or  jogged regularly before your pregnancy. ? Can run or jog and still talk in complete sentences. What exercises should I avoid? Depending on your level of fitness and whether you exercised regularly before your pregnancy, you may be told to limit high-intensity exercise. You can tell that you are exercising at a high  intensity if you are breathing much harder and faster and cannot hold a conversation while exercising. You must avoid:  Contact sports.  Activities that put you at risk for falling on or being hit in the belly, such as downhill skiing, water skiing, surfing, rock climbing, cycling, gymnastics, and horseback riding.  Scuba diving.  Skydiving.  Yoga or Pilates in a room that is heated to high temperatures.  Jogging or running, unless you ran or jogged regularly before your pregnancy. While jogging or running, you should always be able to talk in full sentences. Do not run or jog so fast that you are unable to have a conversation.  Do not exercise at more than 6,000 feet above sea level (high elevation) if you are not used to exercising at high elevation. How do I exercise in a safe way?   Avoid overheating. Do not exercise in very high temperatures.  Wear loose-fitting, breathable clothes.  Avoid dehydration. Drink enough water before, during, and after exercise to keep your urine pale yellow.  Avoid overstretching. Because of hormone changes during pregnancy, it is easy to overstretch muscles, tendons, and ligaments during pregnancy.  Start slowly and ask your health care provider to recommend the types of exercise that are safe for you.  Do not exercise to lose weight. Follow these instructions at home:  Exercise on most days or all days of the week. Try to exercise for 30 minutes a day, 5 days a week, unless your health care provider tells you not to.  If you actively exercised before your pregnancy and you are healthy, your health care provider may tell you to continue to do moderate to high-intensity exercise.  If you are just starting to exercise or did not exercise much before your pregnancy, your health care provider may tell you to do low to moderate-intensity exercise. Questions to ask your health care provider  Is exercise safe for me?  What are signs that I should  stop exercising?  Does my health condition mean that I should not exercise during pregnancy?  When should I avoid exercising during pregnancy? Stop exercising and contact a health care provider if: You have any unusual symptoms, such as:  Mild contractions of the uterus or cramps in the abdomen.  Dizziness that does not go away when you rest. Stop exercising and get help right away if: You have any unusual symptoms, such as:  Sudden, severe pain in your low back or your belly.  Mild contractions of the uterus or cramps in the abdomen that do not improve with rest and drinking fluids.  Chest pain.  Bleeding or fluid leaking from your vagina.  Shortness of breath. These symptoms may represent a serious problem that is an emergency. Do not wait to see if the symptoms will go away. Get medical help right away. Call your local emergency services (911 in the [place] Do not drive yourself to the hospital. Summary  Most women should exercise regularly throughout pregnancy. In rare cases, women with certain medical conditions or complications may be asked to limit or avoid exercise during pregnancy.  Do not exercise to lose weight during pregnancy.  Your health care provider will  tell you what level of physical activity is right for you.  Stop exercising and contact a health care provider if you have mild contractions of the uterus or cramps in the abdomen. Get help right away if these contractions or cramps do not improve with rest and drinking fluids.  Stop exercising and get help right away if you have sudden, severe pain in your low back or belly, chest pain, shortness of breath, or bleeding or leaking of fluid from your vagina. This information is not intended to replace advice given to you by your health care provider. Make sure you discuss any questions you have with your health care provider. Document Revised: 01/26/2019 Document Reviewed: 11/09/2018 Elsevier Patient Education   [place].   Common Medications Safe in Pregnancy  Acne:      Constipation:  Benzoyl Peroxide     Colace  Clindamycin      Dulcolax Suppository  Topica Erythromycin     Fibercon  Salicylic Acid      Metamucil         Miralax AVOID:        Senakot   Accutane    Cough:  Retin-A       Cough Drops  Tetracycline      Phenergan w/ Codeine if Rx  Minocycline      Robitussin (Plain & DM)  Antibiotics:     Crabs/Lice:  Ceclor       RID  Cephalosporins    AVOID:  E-Mycins      Kwell  Keflex  Macrobid/Macrodantin   Diarrhea:  Penicillin      Kao-Pectate  Zithromax      Imodium AD         PUSH FLUIDS AVOID:       Cipro     Fever:  Tetracycline      Tylenol (Regular or Extra  Minocycline       Strength)  Levaquin      Extra Strength-Do not          Exceed 8 tabs/24 hrs Caffeine:        <261m/day (equiv. To 1 cup of coffee or  approx. 3 12 oz sodas)         Gas: Cold/Hayfever:       Gas-X  Benadryl      Mylicon  Claritin       Phazyme  **Claritin-D        Chlor-Trimeton    Headaches:  Dimetapp      ASA-Free Excedrin  Drixoral-Non-Drowsy     Cold Compress  Mucinex (Guaifenasin)     Tylenol (Regular or Extra  Sudafed/Sudafed-12 Hour     Strength)  **Sudafed PE Pseudoephedrine   Tylenol Cold & Sinus     Vicks Vapor Rub  Zyrtec  **AVOID if Problems With Blood Pressure         Heartburn: Avoid lying down for at least 1 hour after meals  Aciphex      Maalox     Rash:  Milk of Magnesia     Benadryl    Mylanta       1% Hydrocortisone Cream  Pepcid  Pepcid Complete   Sleep Aids:  Prevacid      Ambien   Prilosec       Benadryl  Rolaids       Chamomile Tea  Tums (Limit 4/day)     Unisom  Zantac       Tylenol PM  Warm milk-add vanilla or  Hemorrhoids:       Sugar for taste  Anusol/Anusol H.C.  (RX: Analapram 2.5%)  Sugar Substitutes:  Hydrocortisone OTC     Ok in moderation  Preparation H      Tucks        Vaseline lotion applied to tissue with  wiping    Herpes:     Throat:  Acyclovir      Oragel  Famvir  Valtrex     Vaccines:         Flu Shot Leg Cramps:       *Gardasil  Benadryl      Hepatitis A         Hepatitis B Nasal Spray:       Pneumovax  Saline Nasal Spray     [place] Booster         Tetanus Nausea:       Tuberculosis test or PPD  Vitamin B6 25 mg TID   AVOID:    Dramamine      *Gardasil  Emetrol       Live Poliovirus  Ginger Root 250 mg QID    MMR (measles, mumps &  High Complex Carbs @ Bedtime    rebella)  Sea Bands-Accupressure    Varicella (Chickenpox)  Unisom 1/2 tab TID     *No known complications           If received before Pain:         Known pregnancy;   Darvocet       Resume series after  Lortab        Delivery  Percocet    Yeast:   Tramadol      Femstat  Tylenol 3      Gyne-lotrimin  Ultram       Monistat  Vicodin           MISC:         All Sunscreens           Hair Coloring/highlights          Insect Repellant's          (Including DEET)         Mystic Tans

## 2020-01-11 NOTE — Lactation Note (Signed)
Lactation Consultation Note  Patient Name: Gabrielle Byrd [id number] Date: 01/11/2020   Lactation student discussed benefits of breastfeeding per the Ready, Set, Baby curriculum. Gabrielle Byrd encouraged to review breastfeeding information on Ready, set, Baby web site and given information for virtual breastfeeding classes.   Maternal Data    Feeding    LATCH Score                   Interventions    Lactation Tools Discussed/Used     Consult Status      [name] 01/11/2020, 1:55 PM

## 2020-01-11 NOTE — Progress Notes (Signed)
NEW OB HISTORY AND PHYSICAL  SUBJECTIVE:       Gabrielle Byrd is a 24 y.o. G73P0000 female, Patient's last menstrual period was 10/21/2019 (exact date)., Estimated Date of Delivery: 07/27/20, [redacted]w[redacted]d, presents today for establishment of Prenatal Care.  Reports increased breast tenderness. Desires genetic screening. Prefers midwifery care.   Denies difficulty breathing or respiratory distress, chest pain, abdominal pain, vaginal bleeding, dysuria, and leg pain or swelling.    Gynecologic History  Patient's last menstrual period was 10/21/2019 (exact date).   Contraception: none   Last Pap: due   Obstetric History  OB History  Gravida Para Term Preterm AB Living  1 0 0 0 0 0  SAB TAB Ectopic Multiple Live Births  0 0 0 0 0    # Outcome Date GA Lbr Len/2nd Weight Sex Delivery Anes PTL Lv  1 Current             Past Medical History:  Diagnosis Date  . Anemia    Hx of  . Asthma   . Obesity (BMI 30-39.9)   . Rhinitis, allergic   . Tonsillitis    chronic    Past Surgical History:  Procedure Laterality Date  . TONSILLECTOMY Bilateral 03/30/2019   Procedure: TONSILLECTOMY;  Surgeon: Vernie Murders, MD;  Location: [facility];  Service: ENT;  Laterality: Bilateral;  . WISDOM TOOTH EXTRACTION      Current Outpatient Medications on File Prior to Visit  Medication Sig Dispense Refill  . clindamycin-benzoyl peroxide (BENZACLIN) gel Apply topically 2 (two) times daily.    . Prenatal Vit-Fe Fumarate-FA (PRENATAL VITAMIN) 27-0.8 MG TABS Take 1 tablet by mouth daily at 6 (six) AM. 100 tablet 0  . PROAIR HFA 108 (90 Base) MCG/ACT inhaler USE 2 INHALATIONS EVERY FOUR TO SIX HOURS AS NEEDED  5   No current facility-administered medications on file prior to visit.    No Known Allergies  Social History   Socioeconomic History  . Marital status: Single    Spouse name: Not on file  . Number of children: Not on file  . Years of education: Not on file  . Highest education  level: Not on file  Occupational History  . Not on file  Tobacco Use  . Smoking status: Never Smoker  . Smokeless tobacco: Never Used  Substance and Sexual Activity  . Alcohol use: Not Currently    Comment: Last ETOH use 4 - 5 weeks ago.  . Drug use: Not Currently    Types: Marijuana    Comment: Last marijuana use 2 weeks ago  . Sexual activity: Yes    Birth control/protection: None  Other Topics Concern  . Not on file  Social History Narrative  . Not on file   Social Determinants of Health   Financial Resource Strain:   . Difficulty of Paying Living Expenses:   Food Insecurity:   . Worried About Programme researcher, broadcasting/film/video in the Last Year:   . Barista in the Last Year:   Transportation Needs:   . Freight forwarder (Medical):   Marland Kitchen Lack of Transportation (Non-Medical):   Physical Activity:   . Days of Exercise per Week:   . Minutes of Exercise per Session:   Stress:   . Feeling of Stress :   Social Connections:   . Frequency of Communication with Friends and Family:   . Frequency of Social Gatherings with Friends and Family:   . Attends Religious Services:   . Active  Member of Clubs or Organizations:   . Attends Archivist Meetings:   Marland Kitchen Marital Status:   Intimate Partner Violence: Not At Risk  . Fear of Current or Ex-Partner: No  . Emotionally Abused: No  . Physically Abused: No  . Sexually Abused: No    Family History  Problem Relation Age of Onset  . Asthma Mother   . Rheum arthritis Mother   . Hypertension Mother   . Depression Mother   . Heart failure Father   . Hypertension Father   . Heart disease Father   . Diabetes Maternal Grandmother   . Rheum arthritis Maternal Grandmother   . Heart failure Maternal Grandfather   . Cancer Maternal Grandfather   . Rheum arthritis Maternal Grandfather   . Ovarian cancer Maternal Aunt   . Breast cancer Neg Hx   . Colon cancer Neg Hx     The following portions of the patient's history were  reviewed and updated as appropriate: allergies, current medications, past OB history, past medical history, past surgical history, past family history, past social history, and problem list.  Review of Systems:  ROS negative except as noted above. Information obtained from patient.   OBJECTIVE:  BP 102/68   Pulse 95   Wt 199 lb 6.4 oz (90.4 kg)   LMP 10/21/2019 (Exact Date)   BMI 35.32 kg/m   Initial Physical Exam (New OB)  GENERAL APPEARANCE: alert, well appearing, in no apparent distress  HEAD: normocephalic, atraumatic  MOUTH: mucous membranes moist, pharynx normal without lesions  THYROID: no thyromegaly or masses present  BREASTS: no masses noted, no significant tenderness, no palpable axillary nodes, no skin changes, bilateral nipple piercings presnts  LUNGS: clear to auscultation, no wheezes, rales or rhonchi, symmetric air entry  HEART: regular rate and rhythm, no murmurs  ABDOMEN: soft, nontender, nondistended, no abnormal masses, no epigastric pain, obese and FHT present  EXTREMITIES: no redness or tenderness in the calves or thighs, no edema  SKIN: normal coloration and turgor, no rashes, professional tattoos present  LYMPH NODES: no adenopathy palpable  NEUROLOGIC: alert, oriented, normal speech, no focal findings or movement disorder noted  PELVIC EXAM EXTERNAL GENITALIA: normal appearing vulva with no masses, tenderness or lesions VAGINA: no abnormal discharge or lesions CERVIX: no lesions or cervical motion tenderness and Pap collected  ASSESSMENT: Normal pregnancy Obesity in pregnancy-PMH completed, normal A1c Desires genetic screening-[place] today Prefers midwifery care  PLAN: Prenatal care New OB counseling: The patient has been given an overview regarding routine prenatal care. Recommendations regarding diet, weight gain, and exercise in pregnancy were given. Prenatal testing, optional genetic testing, and ultrasound use in pregnancy were  reviewed.  Benefits of Breast Feeding were discussed. The patient is encouraged to consider nursing her baby post partum. See orders   Diona Fanti, CNM [facility], [facility] 01/11/20 5:19 PM

## 2020-01-12 LAB — CBC
Hematocrit: 36.8 % (ref 34.0–46.6)
Hemoglobin: 12.3 g/dL (ref 11.1–15.9)
MCH: 29.6 pg (ref 26.6–33.0)
MCHC: 33.4 g/dL (ref 31.5–35.7)
MCV: 89 fL (ref 79–97)
Platelets: 273 10*3/uL (ref 150–450)
RBC: 4.15 x10E6/uL (ref 3.77–5.28)
RDW: 13 % (ref 11.7–15.4)
WBC: 5.9 10*3/uL (ref 3.4–10.8)

## 2020-01-18 ENCOUNTER — Telehealth: Payer: Self-pay

## 2020-01-18 LAB — CYTOLOGY - PAP
Comment: NEGATIVE
Comment: NEGATIVE
Diagnosis: UNDETERMINED — AB
HPV 16: POSITIVE — AB
HPV 18 / 45: NEGATIVE
High risk HPV: POSITIVE — AB

## 2020-01-18 NOTE — Telephone Encounter (Signed)
mychart message sent to patient

## 2020-02-07 ENCOUNTER — Ambulatory Visit (INDEPENDENT_AMBULATORY_CARE_PROVIDER_SITE_OTHER): Payer: Medicaid Other | Admitting: Certified Nurse Midwife

## 2020-02-07 ENCOUNTER — Other Ambulatory Visit: Payer: Self-pay

## 2020-02-07 VITALS — BP 110/66 | HR 84 | Wt 197.4 lb

## 2020-02-07 DIAGNOSIS — Z3402 Encounter for supervision of normal first pregnancy, second trimester: Secondary | ICD-10-CM

## 2020-02-07 DIAGNOSIS — O99212 Obesity complicating pregnancy, second trimester: Secondary | ICD-10-CM

## 2020-02-07 NOTE — Patient Instructions (Signed)

## 2020-02-07 NOTE — Progress Notes (Signed)
ROB doing well. Discussed early glucose tolerance test due to elevated BMI, she verbalizes and agrees. Discussed round ligament pain. She will follow up 1 wk for lab appointment 4 wks for anatomy u/s ROB with Marcelino Duster.   Doreene Burke, CNM

## 2020-02-12 ENCOUNTER — Other Ambulatory Visit: Payer: Self-pay

## 2020-02-12 ENCOUNTER — Other Ambulatory Visit: Payer: Medicaid Other

## 2020-02-12 DIAGNOSIS — O99212 Obesity complicating pregnancy, second trimester: Secondary | ICD-10-CM

## 2020-02-12 DIAGNOSIS — Z3402 Encounter for supervision of normal first pregnancy, second trimester: Secondary | ICD-10-CM

## 2020-02-13 LAB — GLUCOSE TOLERANCE, 1 HOUR: Glucose, 1Hr PP: 87 mg/dL (ref 65–199)

## 2020-03-05 ENCOUNTER — Telehealth: Payer: Self-pay | Admitting: Certified Nurse Midwife

## 2020-03-05 NOTE — Telephone Encounter (Signed)
Called patient to inform her of her U/S appointment change, was unable to reach patient. I sent patient a MyChart message regarding her appointment change.

## 2020-03-07 ENCOUNTER — Ambulatory Visit (INDEPENDENT_AMBULATORY_CARE_PROVIDER_SITE_OTHER): Payer: Medicaid Other | Admitting: Certified Nurse Midwife

## 2020-03-07 ENCOUNTER — Other Ambulatory Visit: Payer: Medicaid Other

## 2020-03-07 ENCOUNTER — Other Ambulatory Visit: Payer: Self-pay

## 2020-03-07 VITALS — BP 120/55 | HR 85 | Wt 204.5 lb

## 2020-03-07 DIAGNOSIS — Z3A19 19 weeks gestation of pregnancy: Secondary | ICD-10-CM

## 2020-03-07 DIAGNOSIS — Z3402 Encounter for supervision of normal first pregnancy, second trimester: Secondary | ICD-10-CM

## 2020-03-07 LAB — POCT URINALYSIS DIPSTICK OB
Bilirubin, UA: NEGATIVE
Blood, UA: NEGATIVE
Glucose, UA: NEGATIVE
Ketones, UA: NEGATIVE
Leukocytes, UA: NEGATIVE
Nitrite, UA: NEGATIVE
POC,PROTEIN,UA: NEGATIVE
Spec Grav, UA: 1.025 (ref 1.010–1.025)
Urobilinogen, UA: 0.2 E.U./dL
pH, UA: 5 (ref 5.0–8.0)

## 2020-03-07 NOTE — Progress Notes (Signed)
I have seen, interviewed, and examined the patient in conjunction with the [employer] Nurse Practitioner student and affirm the diagnosis and management plan.   Gunnar Bulla, CNM [facility], [facility] 03/07/20 11:25 AM

## 2020-03-07 NOTE — Progress Notes (Signed)
ROB- Doing well. Is considering transferring her care to an office in [place] in [place] because she would like to deliver at [facility] or [facility]. Discussed with patient best time to transfer is by twenty-eight (28) weeks. Would like to breastfeed. Has not picked name for baby boy yet. Anticipatory guidance given. Reviewed red flags and when to call the office.  Add ROB after ANATOMY SCAN Wednesday then ROB in 5 weeks with JML or sooner if needed.   Glorious Peach RN [facility] [employer] 03/07/20 11:18 AM

## 2020-03-07 NOTE — Patient Instructions (Signed)
Round Ligament Pain  The round ligament is a cord of muscle and tissue that helps support the uterus. It can become a source of pain during pregnancy if it becomes stretched or twisted as the baby grows. The pain usually begins in the second trimester (13-28 weeks) of pregnancy, and it can come and go until the baby is delivered. It is not a serious problem, and it does not cause harm to the baby. Round ligament pain is usually a short, sharp, and pinching pain, but it can also be a dull, lingering, and aching pain. The pain is felt in the lower side of the abdomen or in the groin. It usually starts deep in the groin and moves up to the outside of the hip area. The pain may occur when you:  Suddenly change position, such as quickly going from a sitting to standing position.  Roll over in bed.  Cough or sneeze.  Do physical activity. Follow these instructions at home:   Watch your condition for any changes.  When the pain starts, relax. Then try any of these methods to help with the pain: ? Sitting down. ? Flexing your knees up to your abdomen. ? Lying on your side with one pillow under your abdomen and another pillow between your legs. ? Sitting in a warm bath for 15-20 minutes or until the pain goes away.  Take over-the-counter and prescription medicines only as told by your health care provider.  Move slowly when you sit down or stand up.  Avoid long walks if they cause pain.  Stop or reduce your physical activities if they cause pain.  Keep all follow-up visits as told by your health care provider. This is important. Contact a health care provider if:  Your pain does not go away with treatment.  You feel pain in your back that you did not have before.  Your medicine is not helping. Get help right away if:  You have a fever or chills.  You develop uterine contractions.  You have vaginal bleeding.  You have nausea or vomiting.  You have diarrhea.  You have pain  when you urinate. Summary  Round ligament pain is felt in the lower abdomen or groin. It is usually a short, sharp, and pinching pain. It can also be a dull, lingering, and aching pain.  This pain usually begins in the second trimester (13-28 weeks). It occurs because the uterus is stretching with the growing baby, and it is not harmful to the baby.  You may notice the pain when you suddenly change position, when you cough or sneeze, or during physical activity.  Relaxing, flexing your knees to your abdomen, lying on one side, or taking a warm bath may help to get rid of the pain.  Get help from your health care provider if the pain does not go away or if you have vaginal bleeding, nausea, vomiting, diarrhea, or painful urination. This information is not intended to replace advice given to you by your health care provider. Make sure you discuss any questions you have with your health care provider. Document Revised: 03/23/2018 Document Reviewed: 03/23/2018 Elsevier Patient Education  [place] of Pregnancy  The second trimester is from week 14 through week 27 (month 4 through 6). This is often the time in pregnancy that you feel your best. Often times, morning sickness has lessened or quit. You may have more energy, and you may get hungry more often. Your unborn baby  is growing rapidly. At the end of the sixth month, he or she is about 9 inches long and weighs about 1 pounds. You will likely feel the baby move between 18 and 20 weeks of pregnancy. Follow these instructions at home: Medicines  Take over-the-counter and prescription medicines only as told by your doctor. Some medicines are safe and some medicines are not safe during pregnancy.  Take a prenatal vitamin that contains at least 600 micrograms (mcg) of folic acid.  If you have trouble pooping (constipation), take medicine that will make your stool soft (stool softener) if your doctor  approves. Eating and drinking   Eat regular, healthy meals.  Avoid raw meat and uncooked cheese.  If you get low calcium from the food you eat, talk to your doctor about taking a daily calcium supplement.  Avoid foods that are high in fat and sugars, such as fried and sweet foods.  If you feel sick to your stomach (nauseous) or throw up (vomit): ? Eat 4 or 5 small meals a day instead of 3 large meals. ? Try eating a few soda crackers. ? Drink liquids between meals instead of during meals.  To prevent constipation: ? Eat foods that are high in fiber, like fresh fruits and vegetables, whole grains, and beans. ? Drink enough fluids to keep your pee (urine) clear or pale yellow. Activity  Exercise only as told by your doctor. Stop exercising if you start to have cramps.  Do not exercise if it is too hot, too humid, or if you are in a place of great height (high altitude).  Avoid heavy lifting.  Wear low-heeled shoes. Sit and stand up straight.  You can continue to have sex unless your doctor tells you not to. Relieving pain and discomfort  Wear a good support bra if your breasts are tender.  Take warm water baths (sitz baths) to soothe pain or discomfort caused by hemorrhoids. Use hemorrhoid cream if your doctor approves.  Rest with your legs raised if you have leg cramps or low back pain.  If you develop puffy, bulging veins (varicose veins) in your legs: ? Wear support hose or compression stockings as told by your doctor. ? Raise (elevate) your feet for 15 minutes, 3-4 times a day. ? Limit salt in your food. Prenatal care  Write down your questions. Take them to your prenatal visits.  Keep all your prenatal visits as told by your doctor. This is important. Safety  Wear your seat belt when driving.  Make a list of emergency phone numbers, including numbers for family, friends, the hospital, and police and fire departments. General instructions  Ask your doctor  about the right foods to eat or for help finding a counselor, if you need these services.  Ask your doctor about local prenatal classes. Begin classes before month 6 of your pregnancy.  Do not use hot tubs, steam rooms, or saunas.  Do not douche or use tampons or scented sanitary pads.  Do not cross your legs for long periods of time.  Visit your dentist if you have not done so. Use a soft toothbrush to brush your teeth. Floss gently.  Avoid all smoking, herbs, and alcohol. Avoid drugs that are not approved by your doctor.  Do not use any products that contain nicotine or tobacco, such as cigarettes and e-cigarettes. If you need help quitting, ask your doctor.  Avoid cat litter boxes and soil used by cats. These carry germs that can cause birth defects  baby and can cause a loss of your baby (miscarriage) or stillbirth. Contact a doctor if:  You have mild cramps or pressure in your lower belly.  You have pain when you pee (urinate).  You have bad smelling fluid coming from your vagina.  You continue to feel sick to your stomach (nauseous), throw up (vomit), or have watery poop (diarrhea).  You have a nagging pain in your belly area.  You feel dizzy. Get help right away if:  You have a fever.  You are leaking fluid from your vagina.  You have spotting or bleeding from your vagina.  You have severe belly cramping or pain.  You lose or gain weight rapidly.  You have trouble catching your breath and have chest pain.  You notice sudden or extreme puffiness (swelling) of your face, hands, ankles, feet, or legs.  You have not felt the baby move in over an hour.  You have severe headaches that do not go away when you take medicine.  You have trouble seeing. Summary  The second trimester is from week 14 through week 27 (months 4 through 6). This is often the time in pregnancy that you feel your best.  To take care of yourself and your unborn baby, you will need to eat healthy meals,  take medicines only if your doctor tells you to do so, and do activities that are safe for you and your baby.  Call your doctor if you get sick or if you notice anything unusual about your pregnancy. Also, call your doctor if you need help with the right food to eat, or if you want to know what activities are safe for you. This information is not intended to replace advice given to you by your health care provider. Make sure you discuss any questions you have with your health care provider. Document Revised: 01/27/2019 Document Reviewed: 11/10/2016 Elsevier Patient Education  2020 Elsevier Inc.  

## 2020-03-13 ENCOUNTER — Ambulatory Visit (INDEPENDENT_AMBULATORY_CARE_PROVIDER_SITE_OTHER): Payer: Medicaid Other

## 2020-03-13 ENCOUNTER — Other Ambulatory Visit: Payer: Self-pay

## 2020-03-13 ENCOUNTER — Ambulatory Visit (INDEPENDENT_AMBULATORY_CARE_PROVIDER_SITE_OTHER): Payer: Medicaid Other | Admitting: Certified Nurse Midwife

## 2020-03-13 ENCOUNTER — Encounter: Payer: Self-pay | Admitting: Certified Nurse Midwife

## 2020-03-13 VITALS — BP 97/69 | HR 90 | Wt 206.2 lb

## 2020-03-13 DIAGNOSIS — Z3A20 20 weeks gestation of pregnancy: Secondary | ICD-10-CM

## 2020-03-13 DIAGNOSIS — Z3402 Encounter for supervision of normal first pregnancy, second trimester: Secondary | ICD-10-CM | POA: Diagnosis not present

## 2020-03-13 LAB — POCT URINALYSIS DIPSTICK OB
Bilirubin, UA: NEGATIVE
Blood, UA: NEGATIVE
Glucose, UA: NEGATIVE
Ketones, UA: NEGATIVE
Leukocytes, UA: NEGATIVE
Nitrite, UA: NEGATIVE
POC,PROTEIN,UA: NEGATIVE
Spec Grav, UA: 1.025 (ref 1.010–1.025)
Urobilinogen, UA: 0.2 E.U./dL
pH, UA: 5 (ref 5.0–8.0)

## 2020-03-13 NOTE — Progress Notes (Signed)
ROB doing well. Feels some movement. Anatomy u/s today ( see below) . Results reviewed. All questions answered. Follow up 4 wk with Marcelino Duster for ROB.   Doreene Burke, CNM   Patient Name: Gabrielle Byrd DOB: September 30, 1996 MRN: [id number] ULTRASOUND REPORT  Location: [facility] Date of Service: 03/13/2020   Indications:Anatomy Ultrasound Findings:  Mason Jim intrauterine pregnancy is visualized with FHR at 151 BPM. Biometrics give an (U/S) Gestational age of [redacted]w[redacted]d and an (U/S) EDD of 07/29/2020; this correlates with the clinically established Estimated Date of Delivery: 07/27/20  Fetal presentation is transverse.  EFW: 334 g ( 12 oz). Fetal Percentile  Placenta: Anterior. Grade: 1 AFI: subjectively normal.  Anatomic survey is complete and normal; Gender - female.    Right Ovary is normal in appearance. Left Ovary is normal appearance. Survey of the adnexa demonstrates no adnexal masses. There is no free peritoneal fluid in the cul de sac.  Impression: 1. [redacted]w[redacted]d Viable Singleton Intrauterine pregnancy by U/S. 2. (U/S) EDD is consistent with Clinically established Estimated Date of Delivery: 07/27/20 . 3. Normal Anatomy Scan  Recommendations: 1.Clinical correlation with the patient's History and Physical Exam.   Jenine M. Marciano Sequin    RDMS

## 2020-03-13 NOTE — Patient Instructions (Signed)

## 2020-03-15 ENCOUNTER — Observation Stay: Payer: Medicaid Other

## 2020-03-15 ENCOUNTER — Other Ambulatory Visit: Payer: Self-pay

## 2020-03-15 ENCOUNTER — Observation Stay
Admission: EM | Admit: 2020-03-15 | Discharge: 2020-03-15 | Disposition: A | Discharge status: Home / Self Care | Payer: Medicaid Other | Attending: Diagnostic Radiology | Admitting: Diagnostic Radiology

## 2020-03-15 ENCOUNTER — Encounter: Payer: Self-pay | Admitting: Obstetrics and Gynecology

## 2020-03-15 DIAGNOSIS — Z3A20 20 weeks gestation of pregnancy: Secondary | ICD-10-CM

## 2020-03-15 DIAGNOSIS — Z042 Encounter for examination and observation following work accident: Principal | ICD-10-CM | POA: Insufficient documentation

## 2020-03-15 DIAGNOSIS — O09899 Supervision of other high risk pregnancies, unspecified trimester: Secondary | ICD-10-CM

## 2020-03-15 NOTE — Progress Notes (Signed)
Pt given discharge instructions including labor precautions and follow up care. Pt verbalized understanding.  

## 2020-03-15 NOTE — Telephone Encounter (Signed)
The pts boyfriend called in Ernestine Mcmurray) is his name. He stated that his gf is at work and got hit in the stomach hard. She has already sent a message but he wanted to call in. He stated that she would like some reassurance that the baby is okay.  Please advise

## 2020-03-15 NOTE — OB Triage Note (Signed)
    L&D OB Triage Note  SUBJECTIVE Gabrielle Byrd is a 24 y.o. G1P0000 female at [redacted]w[redacted]d, EDD Estimated Date of Delivery: 07/27/20 who presented to triage with complaints of hitting her abdomen "fairly hard" on a forklift at work earlier this morning.   She denies any bleeding , lOF, or contractions. She states that her abdomen is sore. She states that she has not felt baby move much but has not been feeling much movement with the pregnancy in general.   OB History  Gravida Para Term Preterm AB Living  1 0 0 0 0 0  SAB TAB Ectopic Multiple Live Births  0 0 0 0 0    # Outcome Date GA Lbr Len/2nd Weight Sex Delivery Anes PTL Lv  1 Current             Medications Prior to Admission  Medication Sig Dispense Refill Last Dose  . Prenatal Vit-Fe Fumarate-FA (PRENATAL VITAMIN) 27-0.8 MG TABS Take 1 tablet by mouth daily at 6 (six) AM. 100 tablet 0 03/15/2020 at Unknown time  . clindamycin-benzoyl peroxide (BENZACLIN) gel Apply topically 2 (two) times daily.     Marland Kitchen PROAIR HFA 108 (90 Base) MCG/ACT inhaler USE 2 INHALATIONS EVERY FOUR TO SIX HOURS AS NEEDED  5      OBJECTIVE  Nursing Evaluation:   BP (!) 119/48 (BP Location: Right Arm)   Pulse 90   Temp 98.5 F (36.9 C) (Oral)   Resp 18   LMP 10/21/2019 (Exact Date)   SpO2 99%     Findings:   U/s WNL, No contractions noted with monitoring, abdomen soft, no vaginal bleeding.  U/s results: Single live intrauterine gestation at 20 weeks 6 days. This is similar to that seen on recent ultrasound from 2 days previous. No definitive acute placental injury is noted.  This exam is performed on an emergent basis and does not comprehensively evaluate fetal size, dating, or anatomy; follow-up complete OB US should be considered if further fetal assessment is warranted.   Electronically Signed   By: Alcide Clever M.D.   On: 03/15/2020 14:49        Fetal heart tones and contraction monitoring was performed and has been reviewed by  me.  NST INTERPRETATION: appropriate for getational age  Mode: External Baseline Rate (A): 150 bpm(fht)  Contraction Frequency (min): none noted  ASSESSMENT Impression:  1.  Pregnancy:  G1P0000 at [redacted]w[redacted]d , EDD Estimated Date of Delivery: 07/27/20 2.  Reassuring fetal and maternal status 3.  Normal u/s , no signs of abruption   PLAN 1. Discussed current condition and above findings with patient and reassurance given.  All questions answered. 2. Discharge home with standard abruption precautions given to return to L&D or call the office for problems. 3. Continue routine prenatal care.    Doreene Burke, CNM

## 2020-03-15 NOTE — OB Triage Note (Signed)
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 . Spt c/o belly soreness and pressure on her pelvic bone. Decreased fetal movement since. No bleeding/LOF. FHT 140.

## 2020-04-06 ENCOUNTER — Other Ambulatory Visit: Payer: Self-pay

## 2020-04-06 DIAGNOSIS — Z3202 Encounter for pregnancy test, result negative: Secondary | ICD-10-CM

## 2020-04-11 ENCOUNTER — Other Ambulatory Visit: Payer: Self-pay

## 2020-04-11 ENCOUNTER — Ambulatory Visit (INDEPENDENT_AMBULATORY_CARE_PROVIDER_SITE_OTHER): Payer: Medicaid Other | Admitting: Certified Nurse Midwife

## 2020-04-11 VITALS — BP 111/64 | HR 98 | Wt 213.2 lb

## 2020-04-11 DIAGNOSIS — Z3202 Encounter for pregnancy test, result negative: Secondary | ICD-10-CM

## 2020-04-11 DIAGNOSIS — Z3A24 24 weeks gestation of pregnancy: Secondary | ICD-10-CM

## 2020-04-11 LAB — POCT URINALYSIS DIPSTICK OB
Bilirubin, UA: NEGATIVE
Blood, UA: NEGATIVE
Glucose, UA: NEGATIVE
Ketones, UA: NEGATIVE
Leukocytes, UA: NEGATIVE
Nitrite, UA: NEGATIVE
POC,PROTEIN,UA: NEGATIVE
Spec Grav, UA: 1.01 (ref 1.010–1.025)
Urobilinogen, UA: 0.2 E.U./dL
pH, UA: 6 (ref 5.0–8.0)

## 2020-04-11 MED ORDER — PRENATAL VITAMIN 27-0.8 MG PO TABS: 1.0000 | ORAL_TABLET | Freq: Every day | ORAL | 0 refills | Status: DC

## 2020-04-11 NOTE — Progress Notes (Signed)
ROB-Doing well, no questions or concerns. Samples of PNV given. Third trimester handouts provided. Baby Friendly education today, see chart. Encouraged childbirth classes. Anticipatory guidance regarding course of prenatal care. Reviewed red flag symptoms and when to call. RTC x 4 weeks for 28 week labs, TDaP and ROB or sooner if needed.

## 2020-04-11 NOTE — Patient Instructions (Signed)
WHAT OB PATIENTS CAN EXPECT   Confirmation of pregnancy and ultrasound ordered if medically indicated-[redacted] weeks gestation  New OB (NOB) intake with nurse and New OB (NOB) labs- [redacted] weeks gestation  New OB (NOB) physical examination with provider- 11/[redacted] weeks gestation  Flu vaccine-[redacted] weeks gestation  Anatomy scan-[redacted] weeks gestation  Glucose tolerance test, blood work to test for anemia, T-dap vaccine-[redacted] weeks gestation  Vaginal swabs/cultures-STD/Group B strep-[redacted] weeks gestation  Appointments every 4 weeks until 28 weeks  Every 2 weeks from 28 weeks until 36 weeks  Weekly visits from 36 weeks until delivery    Back Pain in Pregnancy Back pain during pregnancy is common. Back pain may be caused by several factors that are related to changes during your pregnancy. Follow these instructions at home: Managing pain, stiffness, and swelling      If directed, for sudden (acute) back pain, put ice on the painful area. ? Put ice in a plastic bag. ? Place a towel between your skin and the bag. ? Leave the ice on for 20 minutes, 2-3 times per day.  If directed, apply heat to the affected area before you exercise. Use the heat source that your health care provider recommends, such as a moist heat pack or a heating pad. ? Place a towel between your skin and the heat source. ? Leave the heat on for 20-30 minutes. ? Remove the heat if your skin turns bright red. This is especially important if you are unable to feel pain, heat, or cold. You may have a greater risk of getting burned.  If directed, massage the affected area. Activity  Exercise as told by your health care provider. Gentle exercise is the best way to prevent or manage back pain.  Listen to your body when lifting. If lifting hurts, ask for help or bend your knees. This uses your leg muscles instead of your back muscles.  Squat down when picking up something from the floor. Do not bend over.  Only use bed rest for short  periods as told by your health care provider. Bed rest should only be used for the most severe episodes of back pain. Standing, sitting, and lying down  Do not stand in one place for long periods of time.  Use good posture when sitting. Make sure your head rests over your shoulders and is not hanging forward. Use a pillow on your lower back if necessary.  Try sleeping on your side, preferably the left side, with a pregnancy support pillow or 1-2 regular pillows between your legs. ? If you have back pain after a night's rest, your bed may be too soft. ? A firm mattress may provide more support for your back during pregnancy. General instructions  Do not wear high heels.  Eat a healthy diet. Try to gain weight within your health care provider's recommendations.  Use a maternity girdle, elastic sling, or back brace as told by your health care provider.  Take over-the-counter and prescription medicines only as told by your health care provider.  Work with a physical therapist or massage therapist to find ways to manage back pain. Acupuncture or massage therapy may be helpful.  Keep all follow-up visits as told by your health care provider. This is important. Contact a health care provider if:  Your back pain interferes with your daily activities.  You have increasing pain in other parts of your body. Get help right away if:  You develop numbness, tingling, weakness, or problems with the   the use of your arms or legs.  You develop severe back pain that is not controlled with medicine.  You have a change in bowel or bladder control.  You develop shortness of breath, dizziness, or you faint.  You develop nausea, vomiting, or sweating.  You have back pain that is a rhythmic, cramping pain similar to labor pains. Labor pain is usually 1-2 minutes apart, lasts for about 1 minute, and involves a bearing down feeling or pressure in your pelvis.  You have back pain and your water breaks or  you have vaginal bleeding.  You have back pain or numbness that travels down your leg.  Your back pain developed after you fell.  You develop pain on one side of your back.  You see blood in your urine.  You develop skin blisters in the area of your back pain. Summary  Back pain may be caused by several factors that are related to changes during your pregnancy.  Follow instructions as told by your health care provider for managing pain, stiffness, and swelling.  Exercise as told by your health care provider. Gentle exercise is the best way to prevent or manage back pain.  Take over-the-counter and prescription medicines only as told by your health care provider.  Keep all follow-up visits as told by your health care provider. This is important. This information is not intended to replace advice given to you by your health care provider. Make sure you discuss any questions you have with your health care provider. Document Revised: 01/24/2019 Document Reviewed: 03/23/2018 Elsevier Patient Education  2020 [employer]   Round Ligament Pain  The round ligament is a cord of muscle and tissue that helps support the uterus. It can become a source of pain during pregnancy if it becomes stretched or twisted as the baby grows. The pain usually begins in the second trimester (13-28 weeks) of pregnancy, and it can come and go until the baby is delivered. It is not a serious problem, and it does not cause harm to the baby. Round ligament pain is usually a short, sharp, and pinching pain, but it can also be a dull, lingering, and aching pain. The pain is felt in the lower side of the abdomen or in the groin. It usually starts deep in the groin and moves up to the outside of the hip area. The pain may occur when you:  Suddenly change position, such as quickly going from a sitting to standing position.  Roll over in bed.  Cough or sneeze.  Do physical activity. Follow these instructions at  home:   Watch your condition for any changes.  When the pain starts, relax. Then try any of these methods to help with the pain: ? Sitting down. ? Flexing your knees up to your abdomen. ? Lying on your side with one pillow under your abdomen and another pillow between your legs. ? Sitting in a warm bath for 15-20 minutes or until the pain goes away.  Take over-the-counter and prescription medicines only as told by your health care provider.  Move slowly when you sit down or stand up.  Avoid long walks if they cause pain.  Stop or reduce your physical activities if they cause pain.  Keep all follow-up visits as told by your health care provider. This is important. Contact a health care provider if:  Your pain does not go away with treatment.  You feel pain in your back that you did not have before.  Your medicine is not helping. Get help right away if:  You have a fever or chills.  You develop uterine contractions.  You have vaginal bleeding.  You have nausea or vomiting.  You have diarrhea.  You have pain when you urinate. Summary  Round ligament pain is felt in the lower abdomen or groin. It is usually a short, sharp, and pinching pain. It can also be a dull, lingering, and aching pain.  This pain usually begins in the second trimester (13-28 weeks). It occurs because the uterus is stretching with the growing baby, and it is not harmful to the baby.  You may notice the pain when you suddenly change position, when you cough or sneeze, or during physical activity.  Relaxing, flexing your knees to your abdomen, lying on one side, or taking a warm bath may help to get rid of the pain.  Get help from your health care provider if the pain does not go away or if you have vaginal bleeding, nausea, vomiting, diarrhea, or painful urination. This information is not intended to replace advice given to you by your health care provider. Make sure you discuss any questions you  have with your health care provider. Document Revised: 03/23/2018 Document Reviewed: 03/23/2018 Elsevier Patient Education  [place] of Pregnancy  The third trimester is from week 28 through week 40 (months 7 through 9). This trimester is when your unborn baby (fetus) is growing very fast. At the end of the ninth month, the unborn baby is about 20 inches in length. It weighs about 6-10 pounds. Follow these instructions at home: Medicines  Take over-the-counter and prescription medicines only as told by your doctor. Some medicines are safe and some medicines are not safe during pregnancy.  Take a prenatal vitamin that contains at least 600 micrograms (mcg) of folic acid.  If you have trouble pooping (constipation), take medicine that will make your stool soft (stool softener) if your doctor approves. Eating and drinking   Eat regular, healthy meals.  Avoid raw meat and uncooked cheese.  If you get low calcium from the food you eat, talk to your doctor about taking a daily calcium supplement.  Eat four or five small meals rather than three large meals a day.  Avoid foods that are high in fat and sugars, such as fried and sweet foods.  To prevent constipation: ? Eat foods that are high in fiber, like fresh fruits and vegetables, whole grains, and beans. ? Drink enough fluids to keep your pee (urine) clear or pale yellow. Activity  Exercise only as told by your doctor. Stop exercising if you start to have cramps.  Avoid heavy lifting, wear low heels, and sit up straight.  Do not exercise if it is too hot, too humid, or if you are in a place of great height (high altitude).  You may continue to have sex unless your doctor tells you not to. Relieving pain and discomfort  Wear a good support bra if your breasts are tender.  Take frequent breaks and rest with your legs raised if you have leg cramps or low back pain.  Take warm water baths (sitz  baths) to soothe pain or discomfort caused by hemorrhoids. Use hemorrhoid cream if your doctor approves.  If you develop puffy, bulging veins (varicose veins) in your legs: ? Wear support hose or compression stockings as told by your doctor. ? Raise (elevate) your feet for 15 minutes, 3-4 times a  day. ? Limit salt in your food. Safety  Wear your seat belt when driving.  Make a list of emergency phone numbers, including numbers for family, friends, the hospital, and police and fire departments. Preparing for your baby's arrival To prepare for the arrival of your baby:  Take prenatal classes.  Practice driving to the hospital.  Visit the hospital and tour the maternity area.  Talk to your work about taking leave once the baby comes.  Pack your hospital bag.  Prepare the baby's room.  Go to your doctor visits.  Buy a rear-facing car seat. Learn how to install it in your car. General instructions  Do not use hot tubs, steam rooms, or saunas.  Do not use any products that contain nicotine or tobacco, such as cigarettes and e-cigarettes. If you need help quitting, ask your doctor.  Do not drink alcohol.  Do not douche or use tampons or scented sanitary pads.  Do not cross your legs for long periods of time.  Do not travel for long distances unless you must. Only do so if your doctor says it is okay.  Visit your dentist if you have not gone during your pregnancy. Use a soft toothbrush to brush your teeth. Be gentle when you floss.  Avoid cat litter boxes and soil used by cats. These carry germs that can cause birth defects in the baby and can cause a loss of your baby (miscarriage) or stillbirth.  Keep all your prenatal visits as told by your doctor. This is important. Contact a doctor if:  You are not sure if you are in labor or if your water has broken.  You are dizzy.  You have mild cramps or pressure in your lower belly.  You have a nagging pain in your belly  area.  You continue to feel sick to your stomach, you throw up, or you have watery poop.  You have bad smelling fluid coming from your vagina.  You have pain when you pee. Get help right away if:  You have a fever.  You are leaking fluid from your vagina.  You are spotting or bleeding from your vagina.  You have severe belly cramps or pain.  You lose or gain weight quickly.  You have trouble catching your breath and have chest pain.  You notice sudden or extreme puffiness (swelling) of your face, hands, ankles, feet, or legs.  You have not felt the baby move in over an hour.  You have severe headaches that do not go away with medicine.  You have trouble seeing.  You are leaking, or you are having a gush of fluid, from your vagina before you are 37 weeks.  You have regular belly spasms (contractions) before you are 37 weeks. Summary  The third trimester is from week 28 through week 40 (months 7 through 9). This time is when your unborn baby is growing very fast.  Follow your doctor's advice about medicine, food, and activity.  Get ready for the arrival of your baby by taking prenatal classes, getting all the baby items ready, preparing the baby's room, and visiting your doctor to be checked.  Get help right away if you are bleeding from your vagina, or you have chest pain and trouble catching your breath, or if you have not felt your baby move in over an hour. This information is not intended to replace advice given to you by your health care provider. Make sure you discuss any questions you have with  your health care provider. Document Revised: 01/26/2019 Document Reviewed: 11/10/2016 Elsevier Patient Education  2020 [employer].

## 2020-05-08 ENCOUNTER — Ambulatory Visit (INDEPENDENT_AMBULATORY_CARE_PROVIDER_SITE_OTHER): Payer: Medicaid Other | Admitting: Certified Nurse Midwife

## 2020-05-08 ENCOUNTER — Other Ambulatory Visit: Payer: Self-pay

## 2020-05-08 ENCOUNTER — Encounter: Payer: Self-pay | Admitting: Certified Nurse Midwife

## 2020-05-08 ENCOUNTER — Other Ambulatory Visit: Payer: Medicaid Other

## 2020-05-08 VITALS — BP 96/60 | HR 91 | Wt 220.2 lb

## 2020-05-08 DIAGNOSIS — Z3A28 28 weeks gestation of pregnancy: Secondary | ICD-10-CM

## 2020-05-08 LAB — POCT URINALYSIS DIPSTICK OB
Bilirubin, UA: NEGATIVE
Blood, UA: NEGATIVE
Glucose, UA: NEGATIVE
Ketones, UA: NEGATIVE
Leukocytes, UA: NEGATIVE
Nitrite, UA: NEGATIVE
POC,PROTEIN,UA: NEGATIVE
Spec Grav, UA: 1.015 (ref 1.010–1.025)
Urobilinogen, UA: 0.2 E.U./dL
pH, UA: 5 (ref 5.0–8.0)

## 2020-05-08 MED ORDER — TETANUS-DIPHTH-ACELL PERTUSSIS 5-2.5-18.5 LF-MCG/0.5 IM SUSP
0.5000 mL | Freq: Once | INTRAMUSCULAR | Status: AC
  Administered 2020-05-08: 0.5 mL via INTRAMUSCULAR

## 2020-05-08 NOTE — Progress Notes (Signed)
ROB doing well , feels good fetal movement. 28 wk labs today. Glucose/RPR/CBC/Tdap/BTC today. Sample birth plan given for pt to review. Will discuss next visit. Information of BC given. Breast pump form given. Follow up 2 wk with Marcelino Duster for ROB.   Doreene Burke, CNM

## 2020-05-08 NOTE — Patient Instructions (Signed)
Glucose Tolerance Test During Pregnancy Why am I having this test? The glucose tolerance test (GTT) is done to check how your body processes sugar (glucose). This is one of several tests used to diagnose diabetes that develops during pregnancy (gestational diabetes mellitus). Gestational diabetes is a temporary form of diabetes that some women develop during pregnancy. It usually occurs during the second trimester of pregnancy and goes away after delivery. Testing (screening) for gestational diabetes usually occurs between 24 and 28 weeks of pregnancy. You may have the GTT test after having a 1-hour glucose screening test if the results from that test indicate that you may have gestational diabetes. You may also have this test if:  You have a history of gestational diabetes.  You have a history of giving birth to very large babies or have experienced repeated fetal loss (stillbirth).  You have signs and symptoms of diabetes, such as: ? Changes in your vision. ? Tingling or numbness in your hands or feet. ? Changes in hunger, thirst, and urination that are not otherwise explained by your pregnancy. What is being tested? This test measures the amount of glucose in your blood at different times during a period of 3 hours. This indicates how well your body is able to process glucose. What kind of sample is taken?  Blood samples are required for this test. They are usually collected by inserting a needle into a blood vessel. How do I prepare for this test?  For 3 days before your test, eat normally. Have plenty of carbohydrate-rich foods.  Follow instructions from your health care provider about: ? Eating or drinking restrictions on the day of the test. You may be asked to not eat or drink anything other than water (fast) starting 8-10 hours before the test. ? Changing or stopping your regular medicines. Some medicines may interfere with this test. Tell a health care provider about:  All  medicines you are taking, including vitamins, herbs, eye drops, creams, and over-the-counter medicines.  Any blood disorders you have.  Any surgeries you have had.  Any medical conditions you have. What happens during the test? First, your blood glucose will be measured. This is referred to as your fasting blood glucose, since you fasted before the test. Then, you will drink a glucose solution that contains a certain amount of glucose. Your blood glucose will be measured again 1, 2, and 3 hours after drinking the solution. This test takes about 3 hours to complete. You will need to stay at the testing location during this time. During the testing period:  Do not eat or drink anything other than the glucose solution.  Do not exercise.  Do not use any products that contain nicotine or tobacco, such as cigarettes and e-cigarettes. If you need help stopping, ask your health care provider. The testing procedure may vary among health care providers and hospitals. How are the results reported? Your results will be reported as milligrams of glucose per deciliter of blood (mg/dL) or millimoles per liter (mmol/L). Your health care provider will compare your results to normal ranges that were established after testing a large group of people (reference ranges). Reference ranges may vary among labs and hospitals. For this test, common reference ranges are:  Fasting: less than 95-105 mg/dL (5.3-5.8 mmol/L).  1 hour after drinking glucose: less than 180-190 mg/dL (10.0-10.5 mmol/L).  2 hours after drinking glucose: less than 155-165 mg/dL (8.6-9.2 mmol/L).  3 hours after drinking glucose: 140-145 mg/dL (7.8-8.1 mmol/L). What do the   results mean? Results within reference ranges are considered normal, meaning that your glucose levels are well-controlled. If two or more of your blood glucose levels are high, you may be diagnosed with gestational diabetes. If only one level is high, your health care  provider may suggest repeat testing or other tests to confirm a diagnosis. Talk with your health care provider about what your results mean. Questions to ask your health care provider Ask your health care provider, or the department that is doing the test:  When will my results be ready?  How will I get my results?  What are my treatment options?  What other tests do I need?  What are my next steps? Summary  The glucose tolerance test (GTT) is one of several tests used to diagnose diabetes that develops during pregnancy (gestational diabetes mellitus). Gestational diabetes is a temporary form of diabetes that some women develop during pregnancy.  You may have the GTT test after having a 1-hour glucose screening test if the results from that test indicate that you may have gestational diabetes. You may also have this test if you have any symptoms or risk factors for gestational diabetes.  Talk with your health care provider about what your results mean. This information is not intended to replace advice given to you by your health care provider. Make sure you discuss any questions you have with your health care provider. Document Revised: 01/26/2019 Document Reviewed: 05/17/2017 Elsevier Patient Education  2020 [employer] Inc.  

## 2020-05-09 LAB — CBC
Hematocrit: 32.8 % — ABNORMAL LOW (ref 34.0–46.6)
Hemoglobin: 10.7 g/dL — ABNORMAL LOW (ref 11.1–15.9)
MCH: 29.6 pg (ref 26.6–33.0)
MCHC: 32.6 g/dL (ref 31.5–35.7)
MCV: 91 fL (ref 79–97)
Platelets: 209 10*3/uL (ref 150–450)
RBC: 3.62 x10E6/uL — ABNORMAL LOW (ref 3.77–5.28)
RDW: 12.3 % (ref 11.7–15.4)
WBC: 7.7 10*3/uL (ref 3.4–10.8)

## 2020-05-09 LAB — RPR: RPR Ser Ql: NONREACTIVE

## 2020-05-09 LAB — GLUCOSE, 1 HOUR GESTATIONAL: Gestational Diabetes Screen: 121 mg/dL (ref 65–139)

## 2020-05-13 ENCOUNTER — Other Ambulatory Visit: Payer: Self-pay | Admitting: Certified Nurse Midwife

## 2020-05-13 MED ORDER — FUSION PLUS PO CAPS: 1.0000 | ORAL_CAPSULE | Freq: Every day | ORAL | 6 refills | Status: DC

## 2020-05-23 ENCOUNTER — Encounter: Payer: Self-pay | Admitting: Certified Nurse Midwife

## 2020-05-23 ENCOUNTER — Ambulatory Visit (INDEPENDENT_AMBULATORY_CARE_PROVIDER_SITE_OTHER): Payer: Medicaid Other | Admitting: Certified Nurse Midwife

## 2020-05-23 VITALS — BP 117/72 | HR 92 | Wt 223.0 lb

## 2020-05-23 DIAGNOSIS — Z3403 Encounter for supervision of normal first pregnancy, third trimester: Secondary | ICD-10-CM

## 2020-05-23 DIAGNOSIS — Z3A30 30 weeks gestation of pregnancy: Secondary | ICD-10-CM

## 2020-05-23 DIAGNOSIS — O99210 Obesity complicating pregnancy, unspecified trimester: Secondary | ICD-10-CM

## 2020-05-23 LAB — POCT URINALYSIS DIPSTICK OB
Bilirubin, UA: NEGATIVE
Blood, UA: NEGATIVE
Glucose, UA: NEGATIVE
Ketones, UA: NEGATIVE
Leukocytes, UA: NEGATIVE
Nitrite, UA: NEGATIVE
POC,PROTEIN,UA: NEGATIVE
Spec Grav, UA: 1.015 (ref 1.010–1.025)
Urobilinogen, UA: 0.2 E.U./dL
pH, UA: 7 (ref 5.0–8.0)

## 2020-05-23 NOTE — Progress Notes (Signed)
ROB no complaints. Taking iron as prescribed.

## 2020-05-23 NOTE — Patient Instructions (Signed)
Third Trimester of Pregnancy  The third trimester is from week 28 through week 40 (months 7 through 9). This trimester is when your unborn baby (fetus) is growing very fast. At the end of the ninth month, the unborn baby is about 20 inches in length. It weighs about 6-10 pounds. Follow these instructions at home: Medicines  Take over-the-counter and prescription medicines only as told by your doctor. Some medicines are safe and some medicines are not safe during pregnancy.  Take a prenatal vitamin that contains at least 600 micrograms (mcg) of folic acid.  If you have trouble pooping (constipation), take medicine that will make your stool soft (stool softener) if your doctor approves. Eating and drinking   Eat regular, healthy meals.  Avoid raw meat and uncooked cheese.  If you get low calcium from the food you eat, talk to your doctor about taking a daily calcium supplement.  Eat four or five small meals rather than three large meals a day.  Avoid foods that are high in fat and sugars, such as fried and sweet foods.  To prevent constipation: ? Eat foods that are high in fiber, like fresh fruits and vegetables, whole grains, and beans. ? Drink enough fluids to keep your pee (urine) clear or pale yellow. Activity  Exercise only as told by your doctor. Stop exercising if you start to have cramps.  Avoid heavy lifting, wear low heels, and sit up straight.  Do not exercise if it is too hot, too humid, or if you are in a place of great height (high altitude).  You may continue to have sex unless your doctor tells you not to. Relieving pain and discomfort  Wear a good support bra if your breasts are tender.  Take frequent breaks and rest with your legs raised if you have leg cramps or low back pain.  Take warm water baths (sitz baths) to soothe pain or discomfort caused by hemorrhoids. Use hemorrhoid cream if your doctor approves.  If you develop puffy, bulging veins (varicose  veins) in your legs: ? Wear support hose or compression stockings as told by your doctor. ? Raise (elevate) your feet for 15 minutes, 3-4 times a day. ? Limit salt in your food. Safety  Wear your seat belt when driving.  Make a list of emergency phone numbers, including numbers for family, friends, the hospital, and police and fire departments. Preparing for your baby's arrival To prepare for the arrival of your baby:  Take prenatal classes.  Practice driving to the hospital.  Visit the hospital and tour the maternity area.  Talk to your work about taking leave once the baby comes.  Pack your hospital bag.  Prepare the baby's room.  Go to your doctor visits.  Buy a rear-facing car seat. Learn how to install it in your car. General instructions  Do not use hot tubs, steam rooms, or saunas.  Do not use any products that contain nicotine or tobacco, such as cigarettes and e-cigarettes. If you need help quitting, ask your doctor.  Do not drink alcohol.  Do not douche or use tampons or scented sanitary pads.  Do not cross your legs for long periods of time.  Do not travel for long distances unless you must. Only do so if your doctor says it is okay.  Visit your dentist if you have not gone during your pregnancy. Use a soft toothbrush to brush your teeth. Be gentle when you floss.  Avoid cat litter boxes and soil   used by cats. These carry germs that can cause birth defects in the baby and can cause a loss of your baby (miscarriage) or stillbirth.  Keep all your prenatal visits as told by your doctor. This is important. Contact a doctor if:  You are not sure if you are in labor or if your water has broken.  You are dizzy.  You have mild cramps or pressure in your lower belly.  You have a nagging pain in your belly area.  You continue to feel sick to your stomach, you throw up, or you have watery poop.  You have bad smelling fluid coming from your vagina.  You have  pain when you pee. Get help right away if:  You have a fever.  You are leaking fluid from your vagina.  You are spotting or bleeding from your vagina.  You have severe belly cramps or pain.  You lose or gain weight quickly.  You have trouble catching your breath and have chest pain.  You notice sudden or extreme puffiness (swelling) of your face, hands, ankles, feet, or legs.  You have not felt the baby move in over an hour.  You have severe headaches that do not go away with medicine.  You have trouble seeing.  You are leaking, or you are having a gush of fluid, from your vagina before you are 37 weeks.  You have regular belly spasms (contractions) before you are 37 weeks. Summary  The third trimester is from week 28 through week 40 (months 7 through 9). This time is when your unborn baby is growing very fast.  Follow your doctor's advice about medicine, food, and activity.  Get ready for the arrival of your baby by taking prenatal classes, getting all the baby items ready, preparing the baby's room, and visiting your doctor to be checked.  Get help right away if you are bleeding from your vagina, or you have chest pain and trouble catching your breath, or if you have not felt your baby move in over an hour. This information is not intended to replace advice given to you by your health care provider. Make sure you discuss any questions you have with your health care provider. Document Revised: 01/26/2019 Document Reviewed: 11/10/2016 Elsevier Patient Education  2020 [employer] Inc[employer]   Fetal Movement Counts Patient Name: ________________________________________________ Patient Due Date: ____________________ What is a fetal movement count?  A fetal movement count is the number of times that you feel your baby move during a certain amount of time. This may also be called a fetal kick count. A fetal movement count is recommended for every pregnant woman. You may be asked to  start counting fetal movements as early as week 28 of your pregnancy. Pay attention to when your baby is most active. You may notice your baby's sleep and wake cycles. You may also notice things that make your baby move more. You should do a fetal movement count:  When your baby is normally most active.  At the same time each day. A good time to count movements is while you are resting, after having something to eat and drink. How do I count fetal movements? 1. Find a quiet, comfortable area. Sit, or lie down on your side. 2. Write down the date, the start time and stop time, and the number of movements that you felt between those two times. Take this information with you to your health care visits. 3. Write down your start time when you feel   the first movement. 4. Count kicks, flutters, swishes, rolls, and jabs. You should feel at least 10 movements. 5. You may stop counting after you have felt 10 movements, or if you have been counting for 2 hours. Write down the stop time. 6. If you do not feel 10 movements in 2 hours, contact your health care provider for further instructions. Your health care provider may want to do additional tests to assess your baby's well-being. Contact a health care provider if:  You feel fewer than 10 movements in 2 hours.  Your baby is not moving like he or she usually does. Date: ____________ Start time: ____________ Stop time: ____________ Movements: ____________ Date: ____________ Start time: ____________ Stop time: ____________ Movements: ____________ Date: ____________ Start time: ____________ Stop time: ____________ Movements: ____________ Date: ____________ Start time: ____________ Stop time: ____________ Movements: ____________ Date: ____________ Start time: ____________ Stop time: ____________ Movements: ____________ Date: ____________ Start time: ____________ Stop time: ____________ Movements: ____________ Date: ____________ Start time: ____________  Stop time: ____________ Movements: ____________ Date: ____________ Start time: ____________ Stop time: ____________ Movements: ____________ Date: ____________ Start time: ____________ Stop time: ____________ Movements: ____________ This information is not intended to replace advice given to you by your health care provider. Make sure you discuss any questions you have with your health care provider. Document Revised: 05/25/2019 Document Reviewed: 05/25/2019 Elsevier Patient Education  2020 Elsevier Inc.  

## 2020-05-23 NOTE — Progress Notes (Signed)
ROB-Doing well, no questions or concerns. Taking iron daily. Baby friendly education completed, see chart. Encouraged to enroll in childbirth classes. Anticipatory guidance regarding course of prenatal care. Reviewed red flag symptoms and when to call. RTC x 2 weeks for ROB or sooner if needed.

## 2020-06-04 ENCOUNTER — Other Ambulatory Visit: Payer: Self-pay

## 2020-06-04 ENCOUNTER — Ambulatory Visit (INDEPENDENT_AMBULATORY_CARE_PROVIDER_SITE_OTHER): Payer: Medicaid Other | Admitting: Certified Nurse Midwife

## 2020-06-04 ENCOUNTER — Encounter: Payer: Self-pay | Admitting: Certified Nurse Midwife

## 2020-06-04 VITALS — BP 116/66 | HR 88 | Wt 225.6 lb

## 2020-06-04 DIAGNOSIS — Z3403 Encounter for supervision of normal first pregnancy, third trimester: Secondary | ICD-10-CM

## 2020-06-04 DIAGNOSIS — Z23 Encounter for immunization: Secondary | ICD-10-CM

## 2020-06-04 DIAGNOSIS — Z3A32 32 weeks gestation of pregnancy: Secondary | ICD-10-CM

## 2020-06-04 LAB — POCT URINALYSIS DIPSTICK OB
Bilirubin, UA: NEGATIVE
Blood, UA: NEGATIVE
Glucose, UA: NEGATIVE
Ketones, UA: NEGATIVE
Leukocytes, UA: NEGATIVE
Nitrite, UA: NEGATIVE
POC,PROTEIN,UA: NEGATIVE
Spec Grav, UA: 1.02 (ref 1.010–1.025)
Urobilinogen, UA: 0.2 E.U./dL
pH, UA: 6.5 (ref 5.0–8.0)

## 2020-06-04 NOTE — Patient Instructions (Signed)
[facility]  [facility]  [address], [place], [place] [address]  Phone: [phone]  [facility] (second location)  [address] [place], [place] [address]  Phone: [phone]  [facility] ([place]) [address], [place], [place] [address] Phone: [phone]  [facility]  [address] , [place] [address]  Phone: [phone] 

## 2020-06-04 NOTE — Progress Notes (Signed)
ROB doing well. Feels good movement. Birth plan reviewed. PT plans un medicated natural labor/birth. She is going to bottle feed, plans circ for the baby of female. Copy made and scanned to chart. Follow up 2 wk with Marcelino Duster.   Doreene Burke, CNM

## 2020-06-12 ENCOUNTER — Telehealth: Payer: Self-pay | Admitting: Certified Nurse Midwife

## 2020-06-12 NOTE — Telephone Encounter (Signed)
Called patient to inform her that her FMLA paperwork had been received via fax. Was unable to reach patient. LVM for her to return my phone call.

## 2020-06-13 ENCOUNTER — Telehealth: Payer: Self-pay

## 2020-06-13 DIAGNOSIS — Z0289 Encounter for other administrative examinations: Secondary | ICD-10-CM

## 2020-06-13 NOTE — Telephone Encounter (Signed)
mychart message sent to patient

## 2020-06-13 NOTE — Telephone Encounter (Signed)
mychart message sent- paperwork done and faxed

## 2020-06-13 NOTE — Telephone Encounter (Signed)
Called patient in regards to her FMLA paperwork. No answer- Unable to leave voicemail due to it full.

## 2020-06-17 ENCOUNTER — Encounter: Payer: Self-pay | Admitting: Certified Nurse Midwife

## 2020-06-17 ENCOUNTER — Ambulatory Visit (INDEPENDENT_AMBULATORY_CARE_PROVIDER_SITE_OTHER): Payer: Medicaid Other | Admitting: Certified Nurse Midwife

## 2020-06-17 ENCOUNTER — Other Ambulatory Visit: Payer: Self-pay

## 2020-06-17 VITALS — BP 119/73 | HR 83 | Wt 229.3 lb

## 2020-06-17 DIAGNOSIS — Z3A34 34 weeks gestation of pregnancy: Secondary | ICD-10-CM | POA: Diagnosis not present

## 2020-06-17 DIAGNOSIS — Z3403 Encounter for supervision of normal first pregnancy, third trimester: Secondary | ICD-10-CM

## 2020-06-17 LAB — POCT URINALYSIS DIPSTICK OB
Bilirubin, UA: NEGATIVE
Blood, UA: NEGATIVE
Glucose, UA: NEGATIVE
Ketones, UA: NEGATIVE
Leukocytes, UA: NEGATIVE
Nitrite, UA: NEGATIVE
POC,PROTEIN,UA: NEGATIVE
Spec Grav, UA: 1.02 (ref 1.010–1.025)
Urobilinogen, UA: 0.2 E.U./dL
pH, UA: 6 (ref 5.0–8.0)

## 2020-06-17 NOTE — Patient Instructions (Addendum)
Fetal Movement Counts Patient Name: ________________________________________________ Patient Due Date: ____________________ What is a fetal movement count?  A fetal movement count is the number of times that you feel your baby move during a certain amount of time. This may also be called a fetal kick count. A fetal movement count is recommended for every pregnant woman. You may be asked to start counting fetal movements as early as week 28 of your pregnancy. Pay attention to when your baby is most active. You may notice your baby's sleep and wake cycles. You may also notice things that make your baby move more. You should do a fetal movement count:  When your baby is normally most active.  At the same time each day. A good time to count movements is while you are resting, after having something to eat and drink. How do I count fetal movements? 1. Find a quiet, comfortable area. Sit, or lie down on your side. 2. Write down the date, the start time and stop time, and the number of movements that you felt between those two times. Take this information with you to your health care visits. 3. Write down your start time when you feel the first movement. 4. Count kicks, flutters, swishes, rolls, and jabs. You should feel at least 10 movements. 5. You may stop counting after you have felt 10 movements, or if you have been counting for 2 hours. Write down the stop time. 6. If you do not feel 10 movements in 2 hours, contact your health care provider for further instructions. Your health care provider may want to do additional tests to assess your baby's well-being. Contact a health care provider if:  You feel fewer than 10 movements in 2 hours.  Your baby is not moving like he or she usually does. Date: ____________ Start time: ____________ Stop time: ____________ Movements: ____________ Date: ____________ Start time: ____________ Stop time: ____________ Movements: ____________ Date: ____________  Start time: ____________ Stop time: ____________ Movements: ____________ Date: ____________ Start time: ____________ Stop time: ____________ Movements: ____________ Date: ____________ Start time: ____________ Stop time: ____________ Movements: ____________ Date: ____________ Start time: ____________ Stop time: ____________ Movements: ____________ Date: ____________ Start time: ____________ Stop time: ____________ Movements: ____________ Date: ____________ Start time: ____________ Stop time: ____________ Movements: ____________ Date: ____________ Start time: ____________ Stop time: ____________ Movements: ____________ This information is not intended to replace advice given to you by your health care provider. Make sure you discuss any questions you have with your health care provider. Document Revised: 05/25/2019 Document Reviewed: 05/25/2019 Elsevier Patient Education  2020 Elsevier Inc.  Group B Streptococcus Test During Pregnancy Why am I having this test? Routine testing, also called screening, for group B streptococcus (GBS) is recommended for all pregnant women between the 36th and 37th week of pregnancy. GBS is a type of bacteria that can be passed from mother to baby during childbirth. Screening will help guide whether or not you will need treatment during labor and delivery to prevent complications such as:  An infection in your uterus during labor.  An infection in your uterus after delivery.  A serious infection in your baby after delivery, such as pneumonia, meningitis, or sepsis. GBS screening is not often done before 36 weeks of pregnancy unless you go into labor prematurely. What happens if I have group B streptococcus? If testing shows that you have GBS, your health care provider will recommend treatment with IV antibiotics during labor and delivery. This treatment significantly decreases the risk of complications   for you and your baby. If you have a planned C-section and you  have GBS, you may not need to be treated with antibiotics because GBS is usually passed to babies after labor starts and your water breaks. If you are in labor or your water breaks before your C-section, it is possible for GBS to get into your uterus and be passed to your baby, so you might need treatment. Is there a chance I may not need to be tested? You may not need to be tested for GBS if:  You have a urine test that shows GBS before 36 to 37 weeks.  You had a baby with GBS infection after a previous delivery. In these cases, you will automatically be treated for GBS during labor and delivery. What is being tested? This test is done to check if you have group B streptococcus in your vagina or rectum. What kind of sample is taken? To collect samples for this test, your health care provider will swab your vagina and rectum with a cotton swab. The sample is then sent to the lab to see if GBS is present. What happens during the test?   You will remove your clothing from the waist down.  You will lie down on an exam table in the same position as you would for a pelvic exam.  Your health care provider will swab your vagina and rectum to collect samples for a culture test.  You will be able to go home after the test and do all your usual activities. How are the results reported? The test results are reported as positive or negative. What do the results mean?  A positive test means you are at risk for passing GBS to your baby during labor and delivery. Your health care provider will recommend that you are treated with an IV antibiotic during labor and delivery.  A negative test means you are at very low risk of passing GBS to your baby. There is still a low risk of passing GBS to your baby because sometimes test results may report that you do not have a condition when you do (false-negative result) or there is a chance that you may become infected with GBS after the test is done. You most  likely will not need to be treated with an antibiotic during labor and delivery. Talk with your health care provider about what your results mean. Questions to ask your health care provider Ask your health care provider, or the department that is doing the test:  When will my results be ready?  How will I get my results?  What are my treatment options? Summary  Routine testing (screening) for group B streptococcus (GBS) is recommended for all pregnant women between the 36th and 37th week of pregnancy.  GBS is a type of bacteria that can be passed from mother to baby during childbirth.  If testing shows that you have GBS, your health care provider will recommend that you are treated with IV antibiotics during labor and delivery. This treatment almost always prevents infection in newborns. This information is not intended to replace advice given to you by your health care provider. Make sure you discuss any questions you have with your health care provider. Document Revised: 01/26/2019 Document Reviewed: 11/02/2018 Elsevier Patient Education  2020 [employer].

## 2020-06-20 NOTE — Progress Notes (Signed)
ROB-Doing well, no questions or concerns. Declines Development worker, international aid. Anticipatory guidance regarding course of prenatal care. Reviewed red flag symptoms and when to call. RTC x 2 weeks for 36 week cultures and ROB or sooner if needed.

## 2020-07-01 ENCOUNTER — Ambulatory Visit (INDEPENDENT_AMBULATORY_CARE_PROVIDER_SITE_OTHER): Payer: Medicaid Other | Admitting: Certified Nurse Midwife

## 2020-07-01 ENCOUNTER — Other Ambulatory Visit: Payer: Self-pay

## 2020-07-01 ENCOUNTER — Encounter: Payer: Self-pay | Admitting: Certified Nurse Midwife

## 2020-07-01 DIAGNOSIS — Z3A37 37 weeks gestation of pregnancy: Secondary | ICD-10-CM

## 2020-07-01 NOTE — Patient Instructions (Signed)
Group B Streptococcus Infection During Pregnancy °Group B Streptococcus (GBS) is a type of bacteria that is often found in healthy people. It is commonly found in the rectum, vagina, and intestines. In people who are healthy and not pregnant, the bacteria rarely cause serious illness or complications. However, women who test positive for GBS during pregnancy can pass the bacteria to the baby during childbirth. This can cause serious infection in the baby after birth. °Women with GBS may also have infections during their pregnancy or soon after childbirth. The infections include urinary tract infections (UTIs) or infections of the uterus. GBS also increases a woman's risk of complications during pregnancy, such as early labor or delivery, miscarriage, or stillbirth. Routine testing for GBS is recommended for all pregnant women. °What are the causes? °This condition is caused by bacteria called Streptococcus agalactiae. °What increases the risk? °You may have a higher risk for GBS infection during pregnancy if you had one during a past pregnancy. °What are the signs or symptoms? °In most cases, GBS infection does not cause symptoms in pregnant women. If symptoms exist, they may include: °· Labor that starts before the 37th week of pregnancy. °· A UTI or bladder infection. This may cause a fever, frequent urination, or pain and burning during urination. °· Fever during labor. There can also be a rapid heartbeat in the mother or baby. °Rare but serious symptoms of a GBS infection in women include: °· Blood infection (septicemia). This may cause fever, chills, or confusion. °· Lung infection (pneumonia). This may cause fever, chills, cough, rapid breathing, chest pain, or difficulty breathing. °· Bone, joint, skin, or soft tissue infection. °How is this diagnosed? °You may be screened for GBS between week 35 and week 37 of pregnancy. If you have symptoms of preterm labor, you may be screened earlier. This condition is  diagnosed based on lab test results from: °· A swab of fluid from the vagina and rectum. °· A urine sample. °How is this treated? °This condition is treated with antibiotic medicine. Antibiotic medicine may be given: °· To you when you go into labor, or as soon as your water breaks. The medicines will continue until after you give birth. If you are having a cesarean delivery, you do not need antibiotics unless your water has broken. °· To your baby, if he or she requires treatment. Your health care provider will check your baby to decide if he or she needs antibiotics to prevent a serious infection. °Follow these instructions at home: °· Take over-the-counter and prescription medicines only as told by your health care provider. °· Take your antibiotic medicine as told by your health care provider. Do not stop taking the antibiotic even if you start to feel better. °· Keep all pre-birth (prenatal) visits and follow-up visits as told by your health care provider. This is important. °Contact a health care provider if: °· You have pain or burning when you urinate. °· You have to urinate more often than usual. °· You have a fever or chills. °· You develop a bad-smelling vaginal discharge. °Get help right away if: °· Your water breaks. °· You go into labor. °· You have severe pain in your abdomen. °· You have difficulty breathing. °· You have chest pain. °These symptoms may represent a serious problem that is an emergency. Do not wait to see if the symptoms will go away. Get medical help right away. Call your local emergency services (911 in the [place] Do not drive yourself to   the hospital. °Summary °· GBS is a type of bacteria that is common in healthy people. °· During pregnancy, colonization with GBS can cause serious complications for you or your baby. °· Your health care provider will screen you between 35 and 37 weeks of pregnancy to determine if you are colonized with GBS. °· If you are colonized with GBS during  pregnancy, your health care provider will recommend antibiotics through an IV during labor. °· After delivery, your baby will be evaluated for complications related to potential GBS infection and may require antibiotics to prevent a serious infection. °This information is not intended to replace advice given to you by your health care provider. Make sure you discuss any questions you have with your health care provider. °Document Revised: 05/01/2019 Document Reviewed: 05/01/2019 °Elsevier Patient Education © 2020 Elsevier Inc. ° °

## 2020-07-01 NOTE — Progress Notes (Signed)
ROB doing well. Feels good movement. GBS and cultures today. SVE per pt request. 1/60/-3. Labor precautions reviewed. Herbal prep handout given. Follow up 1 wk with Marcelino Duster.    Doreene Burke, CNM

## 2020-07-02 LAB — CBC
Hematocrit: 34.2 % (ref 34.0–46.6)
Hemoglobin: 11.4 g/dL (ref 11.1–15.9)
MCH: 30 pg (ref 26.6–33.0)
MCHC: 33.3 g/dL (ref 31.5–35.7)
MCV: 90 fL (ref 79–97)
Platelets: 213 10*3/uL (ref 150–450)
RBC: 3.8 x10E6/uL (ref 3.77–5.28)
RDW: 13.3 % (ref 11.7–15.4)
WBC: 6.5 10*3/uL (ref 3.4–10.8)

## 2020-07-03 LAB — STREP GP B NAA: Strep Gp B NAA: POSITIVE — AB

## 2020-07-04 LAB — GC/CHLAMYDIA PROBE AMP
Chlamydia trachomatis, NAA: NEGATIVE
Neisseria Gonorrhoeae by PCR: NEGATIVE

## 2020-07-10 ENCOUNTER — Ambulatory Visit (INDEPENDENT_AMBULATORY_CARE_PROVIDER_SITE_OTHER): Payer: Medicaid Other | Admitting: Certified Nurse Midwife

## 2020-07-10 ENCOUNTER — Other Ambulatory Visit: Payer: Self-pay

## 2020-07-10 VITALS — BP 110/67 | HR 100 | Wt 234.4 lb

## 2020-07-10 DIAGNOSIS — Z3A37 37 weeks gestation of pregnancy: Secondary | ICD-10-CM

## 2020-07-10 LAB — POCT URINALYSIS DIPSTICK OB
Bilirubin, UA: NEGATIVE
Blood, UA: NEGATIVE
Glucose, UA: NEGATIVE
Ketones, UA: NEGATIVE
Leukocytes, UA: NEGATIVE
Nitrite, UA: NEGATIVE
POC,PROTEIN,UA: NEGATIVE
Spec Grav, UA: 1.015 (ref 1.010–1.025)
Urobilinogen, UA: 0.2 E.U./dL
pH, UA: 5 (ref 5.0–8.0)

## 2020-07-10 NOTE — Patient Instructions (Signed)
Braxton Hicks Contractions °Contractions of the uterus can occur throughout pregnancy, but they are not always a sign that you are in labor. You may have practice contractions called Braxton Hicks contractions. These false labor contractions are sometimes confused with true labor. °What are Braxton Hicks contractions? °Braxton Hicks contractions are tightening movements that occur in the muscles of the uterus before labor. Unlike true labor contractions, these contractions do not result in opening (dilation) and thinning of the cervix. Toward the end of pregnancy (32-34 weeks), Braxton Hicks contractions can happen more often and may become stronger. These contractions are sometimes difficult to tell apart from true labor because they can be very uncomfortable. You should not feel embarrassed if you go to the hospital with false labor. °Sometimes, the only way to tell if you are in true labor is for your health care provider to look for changes in the cervix. The health care provider will do a physical exam and may monitor your contractions. If you are not in true labor, the exam should show that your cervix is not dilating and your water has not broken. °If there are no other health problems associated with your pregnancy, it is completely safe for you to be sent home with false labor. You may continue to have Braxton Hicks contractions until you go into true labor. °How to tell the difference between true labor and false labor °True labor °· Contractions last 30-70 seconds. °· Contractions become very regular. °· Discomfort is usually felt in the top of the uterus, and it spreads to the lower abdomen and low back. °· Contractions do not go away with walking. °· Contractions usually become more intense and increase in frequency. °· The cervix dilates and gets thinner. °False labor °· Contractions are usually shorter and not as strong as true labor contractions. °· Contractions are usually irregular. °· Contractions  are often felt in the front of the lower abdomen and in the groin. °· Contractions may go away when you walk around or change positions while lying down. °· Contractions get weaker and are shorter-lasting as time goes on. °· The cervix usually does not dilate or become thin. °Follow these instructions at home: ° °· Take over-the-counter and prescription medicines only as told by your health care provider. °· Keep up with your usual exercises and follow other instructions from your health care provider. °· Eat and drink lightly if you think you are going into labor. °· If Braxton Hicks contractions are making you uncomfortable: °? Change your position from lying down or resting to walking, or change from walking to resting. °? Sit and rest in a tub of warm water. °? Drink enough fluid to keep your urine pale yellow. Dehydration may cause these contractions. °? Do slow and deep breathing several times an hour. °· Keep all follow-up prenatal visits as told by your health care provider. This is important. °Contact a health care provider if: °· You have a fever. °· You have continuous pain in your abdomen. °Get help right away if: °· Your contractions become stronger, more regular, and closer together. °· You have fluid leaking or gushing from your vagina. °· You pass blood-tinged mucus (bloody show). °· You have bleeding from your vagina. °· You have low back pain that you never had before. °· You feel your baby’s head pushing down and causing pelvic pressure. °· Your baby is not moving inside you as much as it used to. °Summary °· Contractions that occur before labor are   called Braxton Hicks contractions, false labor, or practice contractions. °· Braxton Hicks contractions are usually shorter, weaker, farther apart, and less regular than true labor contractions. True labor contractions usually become progressively stronger and regular, and they become more frequent. °· Manage discomfort from Braxton Hicks contractions  by changing position, resting in a warm bath, drinking plenty of water, or practicing deep breathing. °This information is not intended to replace advice given to you by your health care provider. Make sure you discuss any questions you have with your health care provider. °Document Revised: 09/17/2017 Document Reviewed: 02/18/2017 °Elsevier Patient Education © 2020 [employer] ° °

## 2020-07-10 NOTE — Progress Notes (Signed)
ROB doing well. Feels good . Discussed labor precaution . Follow up 1 wk with Marcelino Duster.   Doreene Burke, CNM

## 2020-07-17 ENCOUNTER — Ambulatory Visit (INDEPENDENT_AMBULATORY_CARE_PROVIDER_SITE_OTHER): Payer: Medicaid Other | Admitting: Certified Nurse Midwife

## 2020-07-17 ENCOUNTER — Encounter: Payer: Self-pay | Admitting: Certified Nurse Midwife

## 2020-07-17 ENCOUNTER — Other Ambulatory Visit: Payer: Self-pay

## 2020-07-17 VITALS — BP 111/64 | HR 81 | Wt 236.4 lb

## 2020-07-17 DIAGNOSIS — Z3A38 38 weeks gestation of pregnancy: Secondary | ICD-10-CM

## 2020-07-17 LAB — POCT URINALYSIS DIPSTICK OB
Bilirubin, UA: NEGATIVE
Blood, UA: NEGATIVE
Glucose, UA: NEGATIVE
Ketones, UA: NEGATIVE
Leukocytes, UA: NEGATIVE
Nitrite, UA: NEGATIVE
POC,PROTEIN,UA: NEGATIVE
Spec Grav, UA: 1.015 (ref 1.010–1.025)
Urobilinogen, UA: 0.2 E.U./dL
pH, UA: 5 (ref 5.0–8.0)

## 2020-07-17 NOTE — Progress Notes (Signed)
ROB doing well. Feels good movement. Labor precautions reviewed.  Body mass index is 41.88 kg/m.  Discussed recommendations for induction 39th week due to elevated BMI. She vebalizes and agrees to plan. SVE 1/50/-2. Recommend out patient foley bulb placement. Will schedule induction. Pt to return on Tuesday for growth u/s fundal height 40-41cm

## 2020-07-17 NOTE — Patient Instructions (Signed)
Braxton Hicks Contractions °Contractions of the uterus can occur throughout pregnancy, but they are not always a sign that you are in labor. You may have practice contractions called Braxton Hicks contractions. These false labor contractions are sometimes confused with true labor. °What are Braxton Hicks contractions? °Braxton Hicks contractions are tightening movements that occur in the muscles of the uterus before labor. Unlike true labor contractions, these contractions do not result in opening (dilation) and thinning of the cervix. Toward the end of pregnancy (32-34 weeks), Braxton Hicks contractions can happen more often and may become stronger. These contractions are sometimes difficult to tell apart from true labor because they can be very uncomfortable. You should not feel embarrassed if you go to the hospital with false labor. °Sometimes, the only way to tell if you are in true labor is for your health care provider to look for changes in the cervix. The health care provider will do a physical exam and may monitor your contractions. If you are not in true labor, the exam should show that your cervix is not dilating and your water has not broken. °If there are no other health problems associated with your pregnancy, it is completely safe for you to be sent home with false labor. You may continue to have Braxton Hicks contractions until you go into true labor. °How to tell the difference between true labor and false labor °True labor °· Contractions last 30-70 seconds. °· Contractions become very regular. °· Discomfort is usually felt in the top of the uterus, and it spreads to the lower abdomen and low back. °· Contractions do not go away with walking. °· Contractions usually become more intense and increase in frequency. °· The cervix dilates and gets thinner. °False labor °· Contractions are usually shorter and not as strong as true labor contractions. °· Contractions are usually irregular. °· Contractions  are often felt in the front of the lower abdomen and in the groin. °· Contractions may go away when you walk around or change positions while lying down. °· Contractions get weaker and are shorter-lasting as time goes on. °· The cervix usually does not dilate or become thin. °Follow these instructions at home: ° °· Take over-the-counter and prescription medicines only as told by your health care provider. °· Keep up with your usual exercises and follow other instructions from your health care provider. °· Eat and drink lightly if you think you are going into labor. °· If Braxton Hicks contractions are making you uncomfortable: °? Change your position from lying down or resting to walking, or change from walking to resting. °? Sit and rest in a tub of warm water. °? Drink enough fluid to keep your urine pale yellow. Dehydration may cause these contractions. °? Do slow and deep breathing several times an hour. °· Keep all follow-up prenatal visits as told by your health care provider. This is important. °Contact a health care provider if: °· You have a fever. °· You have continuous pain in your abdomen. °Get help right away if: °· Your contractions become stronger, more regular, and closer together. °· You have fluid leaking or gushing from your vagina. °· You pass blood-tinged mucus (bloody show). °· You have bleeding from your vagina. °· You have low back pain that you never had before. °· You feel your baby’s head pushing down and causing pelvic pressure. °· Your baby is not moving inside you as much as it used to. °Summary °· Contractions that occur before labor are   called Braxton Hicks contractions, false labor, or practice contractions. °· Braxton Hicks contractions are usually shorter, weaker, farther apart, and less regular than true labor contractions. True labor contractions usually become progressively stronger and regular, and they become more frequent. °· Manage discomfort from Braxton Hicks contractions  by changing position, resting in a warm bath, drinking plenty of water, or practicing deep breathing. °This information is not intended to replace advice given to you by your health care provider. Make sure you discuss any questions you have with your health care provider. °Document Revised: 09/17/2017 Document Reviewed: 02/18/2017 °Elsevier Patient Education © 2020 [employer] ° °

## 2020-07-24 ENCOUNTER — Other Ambulatory Visit: Payer: Self-pay

## 2020-07-24 ENCOUNTER — Ambulatory Visit (INDEPENDENT_AMBULATORY_CARE_PROVIDER_SITE_OTHER): Payer: Medicaid Other | Admitting: Certified Nurse Midwife

## 2020-07-24 ENCOUNTER — Other Ambulatory Visit
Admission: RE | Admit: 2020-07-24 | Discharge: 2020-07-24 | Disposition: A | Discharge status: Home / Self Care | Payer: Medicaid Other | Source: Ambulatory Visit | Attending: Certified Nurse Midwife | Admitting: Certified Nurse Midwife

## 2020-07-24 ENCOUNTER — Ambulatory Visit (INDEPENDENT_AMBULATORY_CARE_PROVIDER_SITE_OTHER): Payer: Medicaid Other

## 2020-07-24 ENCOUNTER — Encounter: Payer: Self-pay | Admitting: Certified Nurse Midwife

## 2020-07-24 VITALS — BP 118/72 | HR 86 | Wt 236.4 lb

## 2020-07-24 DIAGNOSIS — Z01818 Encounter for other preprocedural examination: Secondary | ICD-10-CM | POA: Insufficient documentation

## 2020-07-24 DIAGNOSIS — Z3A39 39 weeks gestation of pregnancy: Secondary | ICD-10-CM

## 2020-07-24 DIAGNOSIS — Z20822 Contact with and (suspected) exposure to covid-19: Secondary | ICD-10-CM | POA: Insufficient documentation

## 2020-07-24 DIAGNOSIS — Z3A38 38 weeks gestation of pregnancy: Secondary | ICD-10-CM | POA: Diagnosis not present

## 2020-07-24 LAB — POCT URINALYSIS DIPSTICK OB
Bilirubin, UA: NEGATIVE
Blood, UA: NEGATIVE
Glucose, UA: NEGATIVE
Ketones, UA: NEGATIVE
Leukocytes, UA: NEGATIVE
Nitrite, UA: NEGATIVE
POC,PROTEIN,UA: NEGATIVE
Spec Grav, UA: 1.025 (ref 1.010–1.025)
Urobilinogen, UA: 0.2 E.U./dL
pH, UA: 5 (ref 5.0–8.0)

## 2020-07-24 LAB — SARS CORONAVIRUS 2 (TAT 6-24 HRS): SARS Coronavirus 2: NEGATIVE

## 2020-07-24 NOTE — Progress Notes (Signed)
ROB , doing well. Inductoin scheduled for Thursday morning into Friday night for elevated BMI. Discussed COVID testing today. Pt to return tomorrow afternoon for foley bulb placement with Marcelino Duster. She feels good movement. SVE tight 1 /thick/-3. U/s results reviewed. All questions answered folllow up tomorrow.   Doreene Burke, CNM  Patient Name: Gabrielle Byrd DOB: July 29, 1996 MRN: [id number] ULTRASOUND REPORT  Location: [facility] Date of Service: 07/24/2020   Indications:growth/afi Findings:  Mason Jim intrauterine pregnancy is visualized with FHR at 135 BPM Fetal presentation is Cephalic.  Placenta: anterior. Grade: 1 AFI: 10.8 cm  Growth percentile is 62. EFW: 3622 g ( 8 lbs 0 oz)   Impression: 1. [redacted]w[redacted]d Viable Singleton Intrauterine pregnancy previously established criteria. 2. Growth is 63 %ile.  AFI is 10.8 cm.   Recommendations: 1.Clinical correlation with the patient's History and Physical Exam.   Jenine  M. Marciano Sequin     RDMS

## 2020-07-24 NOTE — Patient Instructions (Signed)

## 2020-07-24 NOTE — Addendum Note (Signed)
Addended by: Brooke Dare on: 07/24/2020 11:18 AM   Modules accepted: Orders

## 2020-07-25 ENCOUNTER — Ambulatory Visit (INDEPENDENT_AMBULATORY_CARE_PROVIDER_SITE_OTHER): Payer: Medicaid Other | Admitting: Certified Nurse Midwife

## 2020-07-25 ENCOUNTER — Other Ambulatory Visit: Payer: Medicaid Other

## 2020-07-25 ENCOUNTER — Emergency Department
Admission: EM | Admit: 2020-07-25 | Discharge: 2020-07-25 | Disposition: A | Discharge status: Other Acute Inpatient Hospital | Payer: Medicaid Other

## 2020-07-25 VITALS — BP 125/53 | Wt 239.2 lb

## 2020-07-25 DIAGNOSIS — Z3403 Encounter for supervision of normal first pregnancy, third trimester: Secondary | ICD-10-CM

## 2020-07-25 DIAGNOSIS — Z3A39 39 weeks gestation of pregnancy: Secondary | ICD-10-CM

## 2020-07-25 LAB — POCT URINALYSIS DIPSTICK OB
Bilirubin, UA: NEGATIVE
Blood, UA: NEGATIVE
Glucose, UA: NEGATIVE
Ketones, UA: NEGATIVE
Leukocytes, UA: NEGATIVE
Nitrite, UA: NEGATIVE
Spec Grav, UA: 1.015 (ref 1.010–1.025)
Urobilinogen, UA: 0.2 E.U./dL
pH, UA: 7.5 (ref 5.0–8.0)

## 2020-07-25 NOTE — Patient Instructions (Signed)

## 2020-07-25 NOTE — Progress Notes (Signed)
FOLEY BULB PROCEDURE NOTE:    I have verified that the patient is an appropriate candidate for outpatient foley bulb placement.  She has consented to foley bulb placement today. She has no concerns at this time. A pre-procedure NST performed today was reviewed and was found to be reactive.     The patient was placed in the dorsal lithotomy position.  A speculum was inserted into the vagina and the cervix was visualized.  The cervix was noted to be 1 cm dilated  A foley catheter was advanced into the cervix slightly pass the internal cervical os.  The foley balloon was filled with 50 cc of normal saline.  Gentle traction was placed on the foley bulb, and the catheter was taped to the medial portion of the thigh.  The patient tolerated the procedure well.  A post-procedure NST was performed. Sterile technique was observed throughout.      NONSTRESS TEST INTERPRETATION  INDICATIONS: Foley bulb induction placement  FHR baseline: 120 bpm (pre-procedure) and 130  bpm (post procedure) RESULTS:Reactive x 2.  COMMENTS: Uterine irritability    PLAN: 1. To arrive at Labor and Delivery for scheduled induction of labor on 07/26/2020 at 0001.  She was given post-procedure instructions.

## 2020-07-26 ENCOUNTER — Encounter: Payer: Self-pay | Admitting: Certified Nurse Midwife

## 2020-07-26 ENCOUNTER — Inpatient Hospital Stay: Admit: 2020-07-26 | Payer: Medicaid Other

## 2020-07-26 ENCOUNTER — Emergency Department
Admission: EM | Admit: 2020-07-26 | Discharge: 2020-07-26 | Disposition: A | Discharge status: Other Acute Inpatient Hospital | Payer: Medicaid Other

## 2020-07-26 ENCOUNTER — Inpatient Hospital Stay: Payer: Medicaid Other | Admitting: Anesthesiology

## 2020-07-26 ENCOUNTER — Inpatient Hospital Stay
Admission: EM | Admit: 2020-07-26 | Discharge: 2020-07-28 | DRG: 807 | Disposition: A | Discharge status: Home / Self Care | Payer: Medicaid Other | Attending: Certified Nurse Midwife | Admitting: Certified Nurse Midwife

## 2020-07-26 ENCOUNTER — Other Ambulatory Visit: Payer: Self-pay

## 2020-07-26 DIAGNOSIS — O99824 Streptococcus B carrier state complicating childbirth: Secondary | ICD-10-CM | POA: Diagnosis present

## 2020-07-26 DIAGNOSIS — O99214 Obesity complicating childbirth: Secondary | ICD-10-CM | POA: Diagnosis present

## 2020-07-26 DIAGNOSIS — Z3A39 39 weeks gestation of pregnancy: Secondary | ICD-10-CM | POA: Diagnosis not present

## 2020-07-26 DIAGNOSIS — E669 Obesity, unspecified: Secondary | ICD-10-CM

## 2020-07-26 DIAGNOSIS — Z2839 Other underimmunization status: Secondary | ICD-10-CM

## 2020-07-26 DIAGNOSIS — Z3490 Encounter for supervision of normal pregnancy, unspecified, unspecified trimester: Secondary | ICD-10-CM | POA: Diagnosis present

## 2020-07-26 DIAGNOSIS — O09899 Supervision of other high risk pregnancies, unspecified trimester: Secondary | ICD-10-CM

## 2020-07-26 LAB — COMPREHENSIVE METABOLIC PANEL
ALT: 15 U/L (ref 0–44)
AST: 19 U/L (ref 15–41)
Albumin: 3.2 g/dL — ABNORMAL LOW (ref 3.5–5.0)
Alkaline Phosphatase: 78 U/L (ref 38–126)
Anion gap: 10 (ref 5–15)
BUN: 8 mg/dL (ref 6–20)
CO2: 22 mmol/L (ref 22–32)
Calcium: 9 mg/dL (ref 8.9–10.3)
Chloride: 106 mmol/L (ref 98–111)
Creatinine, Ser: 0.53 mg/dL (ref 0.44–1.00)
GFR calc non Af Amer: >60 mL/min (ref >60)
Glucose, Bld: 83 mg/dL (ref 70–99)
Potassium: 3.8 mmol/L (ref 3.5–5.1)
Sodium: 138 mmol/L (ref 135–145)
Total Bilirubin: 0.5 mg/dL (ref 0.3–1.2)
Total Protein: 7.2 g/dL (ref 6.5–8.1)

## 2020-07-26 LAB — CBC
HCT: 37.5 % (ref 36.0–46.0)
Hemoglobin: 12.3 g/dL (ref 12.0–15.0)
MCH: 29.6 pg (ref 26.0–34.0)
MCHC: 32.8 g/dL (ref 30.0–36.0)
MCV: 90.4 fL (ref 80.0–100.0)
Platelets: 184 10*3/uL (ref 150–400)
RBC: 4.15 MIL/uL (ref 3.87–5.11)
RDW: 13.9 % (ref 11.5–15.5)
WBC: 7.2 10*3/uL (ref 4.0–10.5)
nRBC: 0 % (ref 0.0–0.2)

## 2020-07-26 LAB — RPR: RPR Ser Ql: NONREACTIVE

## 2020-07-26 LAB — TYPE AND SCREEN
ABO/RH(D): A POS
Antibody Screen: NEGATIVE

## 2020-07-26 LAB — ABO/RH: ABO/RH(D): A POS

## 2020-07-26 MED ORDER — IBUPROFEN 600 MG PO TABS
ORAL_TABLET | ORAL | Status: AC
  Filled 2020-07-26: qty 1

## 2020-07-26 MED ORDER — FENTANYL 2.5 MCG/ML W/ROPIVACAINE 0.15% IN NS 100 ML EPIDURAL (ARMC)
EPIDURAL | Status: AC
  Filled 2020-07-26: qty 100

## 2020-07-26 MED ORDER — ACETAMINOPHEN 325 MG PO TABS
650.0000 mg | ORAL_TABLET | ORAL | Status: DC | PRN
  Administered 2020-07-26: 650 mg via ORAL
  Filled 2020-07-26: qty 2

## 2020-07-26 MED ORDER — SODIUM CHLORIDE 0.9 % IV SOLN: 250.0000 mL | INTRAVENOUS | Status: DC | PRN

## 2020-07-26 MED ORDER — VARICELLA VIRUS VACCINE LIVE 1350 PFU/0.5ML IJ SUSR
0.5000 mL | Freq: Once | INTRAMUSCULAR | Status: DC
  Filled 2020-07-26: qty 0.5

## 2020-07-26 MED ORDER — BUPIVACAINE HCL (PF) 0.25 % IJ SOLN
INTRAMUSCULAR | Status: DC | PRN
  Administered 2020-07-26 (×2): 4 mL via EPIDURAL

## 2020-07-26 MED ORDER — OXYCODONE-ACETAMINOPHEN 5-325 MG PO TABS: 2.0000 | ORAL_TABLET | ORAL | Status: DC | PRN

## 2020-07-26 MED ORDER — PHENYLEPHRINE 40 MCG/ML (10ML) SYRINGE FOR IV PUSH (FOR BLOOD PRESSURE SUPPORT): 80.0000 ug | PREFILLED_SYRINGE | INTRAVENOUS | Status: DC | PRN

## 2020-07-26 MED ORDER — ACETAMINOPHEN 325 MG PO TABS
650.0000 mg | ORAL_TABLET | ORAL | Status: DC | PRN
  Administered 2020-07-26 – 2020-07-27 (×2): 650 mg via ORAL
  Filled 2020-07-26: qty 2

## 2020-07-26 MED ORDER — TERBUTALINE SULFATE 1 MG/ML IJ SOLN: 0.2500 mg | Freq: Once | INTRAMUSCULAR | Status: DC | PRN

## 2020-07-26 MED ORDER — DIPHENHYDRAMINE HCL 50 MG/ML IJ SOLN: 12.5000 mg | INTRAMUSCULAR | Status: DC | PRN

## 2020-07-26 MED ORDER — BENZOCAINE-MENTHOL 20-0.5 % EX AERO: 1.0000 "application " | INHALATION_SPRAY | CUTANEOUS | Status: DC | PRN

## 2020-07-26 MED ORDER — ONDANSETRON HCL 4 MG/2ML IJ SOLN: 4.0000 mg | Freq: Four times a day (QID) | INTRAMUSCULAR | Status: DC | PRN

## 2020-07-26 MED ORDER — SODIUM CHLORIDE 0.9 % IV SOLN
5.0000 10*6.[IU] | Freq: Once | INTRAVENOUS | Status: AC
  Administered 2020-07-26: 5 10*6.[IU] via INTRAVENOUS
  Filled 2020-07-26: qty 5

## 2020-07-26 MED ORDER — FENTANYL 2.5 MCG/ML W/ROPIVACAINE 0.15% IN NS 100 ML EPIDURAL (ARMC)
EPIDURAL | Status: DC | PRN
Start: 2020-07-26 — End: 2020-07-26
  Administered 2020-07-26: 12 mL/h via EPIDURAL

## 2020-07-26 MED ORDER — OXYTOCIN 10 UNIT/ML IJ SOLN
INTRAMUSCULAR | Status: AC
  Filled 2020-07-26: qty 2

## 2020-07-26 MED ORDER — EPHEDRINE 5 MG/ML INJ: 10.0000 mg | INTRAVENOUS | Status: DC | PRN

## 2020-07-26 MED ORDER — LACTATED RINGERS IV SOLN: 500.0000 mL | Freq: Once | INTRAVENOUS | Status: DC

## 2020-07-26 MED ORDER — SODIUM CHLORIDE 0.9% FLUSH: 3.0000 mL | Freq: Two times a day (BID) | INTRAVENOUS | Status: DC

## 2020-07-26 MED ORDER — LACTATED RINGERS IV SOLN: INTRAVENOUS | Status: DC

## 2020-07-26 MED ORDER — ACETAMINOPHEN 325 MG PO TABS
ORAL_TABLET | ORAL | Status: AC
  Filled 2020-07-26: qty 2

## 2020-07-26 MED ORDER — MISOPROSTOL 200 MCG PO TABS
ORAL_TABLET | ORAL | Status: AC
  Filled 2020-07-26: qty 4

## 2020-07-26 MED ORDER — IBUPROFEN 600 MG PO TABS
600.0000 mg | ORAL_TABLET | Freq: Four times a day (QID) | ORAL | Status: DC
  Administered 2020-07-26 – 2020-07-28 (×6): 600 mg via ORAL
  Filled 2020-07-26 (×5): qty 1

## 2020-07-26 MED ORDER — ZOLPIDEM TARTRATE 5 MG PO TABS: 5.0000 mg | ORAL_TABLET | Freq: Every evening | ORAL | Status: DC | PRN

## 2020-07-26 MED ORDER — BUTORPHANOL TARTRATE 1 MG/ML IJ SOLN
1.0000 mg | INTRAMUSCULAR | Status: DC | PRN
  Administered 2020-07-26: 1 mg via INTRAVENOUS
  Filled 2020-07-26: qty 1

## 2020-07-26 MED ORDER — LIDOCAINE HCL (PF) 1 % IJ SOLN: 30.0000 mL | INTRAMUSCULAR | Status: DC | PRN

## 2020-07-26 MED ORDER — LIDOCAINE-EPINEPHRINE (PF) 1.5 %-1:200000 IJ SOLN
INTRAMUSCULAR | Status: DC | PRN
  Administered 2020-07-26: 3 mL via EPIDURAL

## 2020-07-26 MED ORDER — SODIUM CHLORIDE 0.9% FLUSH: 3.0000 mL | INTRAVENOUS | Status: DC | PRN

## 2020-07-26 MED ORDER — OXYTOCIN-SODIUM CHLORIDE 30-0.9 UT/500ML-% IV SOLN
1.0000 m[IU]/min | INTRAVENOUS | Status: DC
  Administered 2020-07-26: 2 m[IU]/min via INTRAVENOUS

## 2020-07-26 MED ORDER — LIDOCAINE HCL (PF) 1 % IJ SOLN
INTRAMUSCULAR | Status: DC | PRN
  Administered 2020-07-26: 3 mL via SUBCUTANEOUS

## 2020-07-26 MED ORDER — OXYTOCIN-SODIUM CHLORIDE 30-0.9 UT/500ML-% IV SOLN
2.5000 [IU]/h | INTRAVENOUS | Status: DC
  Administered 2020-07-27: 2.5 [IU]/h via INTRAVENOUS

## 2020-07-26 MED ORDER — LIDOCAINE HCL (PF) 1 % IJ SOLN
INTRAMUSCULAR | Status: AC
  Filled 2020-07-26: qty 30

## 2020-07-26 MED ORDER — SOD CITRATE-CITRIC ACID 500-334 MG/5ML PO SOLN: 30.0000 mL | ORAL | Status: DC | PRN

## 2020-07-26 MED ORDER — MISOPROSTOL 50MCG HALF TABLET
50.0000 ug | ORAL_TABLET | ORAL | Status: DC
  Administered 2020-07-26 (×2): 50 ug via VAGINAL
  Filled 2020-07-26 (×2): qty 1

## 2020-07-26 MED ORDER — AMMONIA AROMATIC IN INHA
RESPIRATORY_TRACT | Status: AC
  Filled 2020-07-26: qty 10

## 2020-07-26 MED ORDER — PENICILLIN G POT IN DEXTROSE 60000 UNIT/ML IV SOLN
3.0000 10*6.[IU] | INTRAVENOUS | Status: DC
  Administered 2020-07-26 (×2): 3 10*6.[IU] via INTRAVENOUS
  Filled 2020-07-26 (×2): qty 50

## 2020-07-26 MED ORDER — OXYCODONE-ACETAMINOPHEN 5-325 MG PO TABS: 1.0000 | ORAL_TABLET | ORAL | Status: DC | PRN

## 2020-07-26 MED ORDER — LACTATED RINGERS IV SOLN
500.0000 mL | INTRAVENOUS | Status: DC | PRN
  Administered 2020-07-26 (×2): 500 mL via INTRAVENOUS

## 2020-07-26 MED ORDER — OXYTOCIN BOLUS FROM INFUSION
333.0000 mL | Freq: Once | INTRAVENOUS | Status: AC
  Administered 2020-07-26: 333 mL via INTRAVENOUS

## 2020-07-26 MED ORDER — FENTANYL 2.5 MCG/ML W/ROPIVACAINE 0.15% IN NS 100 ML EPIDURAL (ARMC): 12.0000 mL/h | EPIDURAL | Status: DC

## 2020-07-26 NOTE — Progress Notes (Addendum)
RN went to do a fundal check, upon messaging the pt fundus was boggy but firmed quickly. A large amount of urine and blood was expelled. Pads were measured to have of fluid with an estimated of urine. Provider notified and fundus firm upon reassessment.

## 2020-07-26 NOTE — H&P (Signed)
Obstetric History and Physical  Gabrielle Byrd is a 24 y.o. G1P0000 with IUP at [redacted]w[redacted]d presenting for induction of labor due to morbid obesity.   Patient states she has been having irregular contractions, none vaginal bleeding, intact membranes, with active fetal movement.    Denies difficulty breathing or respiratory distress, chest pain, abdominal pain, dysuria, and leg pain or swelling.   Prenatal Course  Source of Care: [facility]-initial visit at 11 weeks, total visits: 15  Pregnancy complications or risks: Obesity in pregnancy, Anemia in pregnancy, Varicella non-immune, Group beta strep positive  Prenatal labs and studies:  ABO, Rh: --/--/A POS Performed at [facility], [address] [place], [place] [address]  [phone]/08 [phone])  Antibody: NEG (10/08 0124)  Rubella: 6.76 (03/01 1028)  Varicella: Non-immune  RPR: Non Reactive (07/21 0906)   HBsAg: Negative (03/01 1028)   HIV: Non Reactive (03/01 1028)   WUX:LKGMWNUU/-- (09/13 1142)   1 hr Glucola: 87 (04/26 0825)  1 hr Glucola: 121 (07/22 0738)  Genetic screening: Low risk female  Anatomy US: Complete, normal  Past Medical History:  Diagnosis Date  . Anemia    Hx of  . Asthma   . Obesity (BMI 30-39.9)   . Rhinitis, allergic   . Tonsillitis    chronic    Past Surgical History:  Procedure Laterality Date  . TONSILLECTOMY Bilateral 03/30/2019   Procedure: TONSILLECTOMY;  Surgeon: Vernie Murders, MD;  Location: [facility];  Service: ENT;  Laterality: Bilateral;  . WISDOM TOOTH EXTRACTION      OB History  Gravida Para Term Preterm AB Living  1 0 0 0 0 0  SAB TAB Ectopic Multiple Live Births  0 0 0 0 0    # Outcome Date GA Lbr Len/2nd Weight Sex Delivery Anes PTL Lv  1 Current             Social History   Socioeconomic History  . Marital status: Single    Spouse name: Not on file  . Number of children: Not on file  . Years of education: Not on file  . Highest education level: Not on  file  Occupational History  . Not on file  Tobacco Use  . Smoking status: Never Smoker  . Smokeless tobacco: Never Used  Vaping Use  . Vaping Use: Never used  Substance and Sexual Activity  . Alcohol use: Not Currently    Comment: Last ETOH use 4 - 5 weeks ago.  . Drug use: Not Currently    Types: Marijuana    Comment: Last marijuana use 2 weeks ago  . Sexual activity: Yes    Birth control/protection: None  Other Topics Concern  . Not on file  Social History Narrative  . Not on file   Social Determinants of Health   Financial Resource Strain:   . Difficulty of Paying Living Expenses: Not on file  Food Insecurity:   . Worried About Programme researcher, broadcasting/film/video in the Last Year: Not on file  . Ran Out of Food in the Last Year: Not on file  Transportation Needs:   . Lack of Transportation (Medical): Not on file  . Lack of Transportation (Non-Medical): Not on file  Physical Activity:   . Days of Exercise per Week: Not on file  . Minutes of Exercise per Session: Not on file  Stress:   . Feeling of Stress : Not on file  Social Connections:   . Frequency of Communication with Friends and Family: Not  on file  . Frequency of Social Gatherings with Friends and Family: Not on file  . Attends Religious Services: Not on file  . Active Member of Clubs or Organizations: Not on file  . Attends Banker Meetings: Not on file  . Marital Status: Not on file    Family History  Problem Relation Age of Onset  . Asthma Mother   . Rheum arthritis Mother   . Hypertension Mother   . Depression Mother   . Heart failure Father   . Hypertension Father   . Heart disease Father   . Diabetes Maternal Grandmother   . Rheum arthritis Maternal Grandmother   . Heart failure Maternal Grandfather   . Cancer Maternal Grandfather   . Rheum arthritis Maternal Grandfather   . Ovarian cancer Maternal Aunt   . Breast cancer Neg Hx   . Colon cancer Neg Hx     Medications Prior to Admission   Medication Sig Dispense Refill Last Dose  . Iron-FA-B Cmp-C-Biot-Probiotic (FUSION PLUS) CAPS Take 1 tablet by mouth daily. 30 capsule 6 07/26/2020 at Unknown time  . Prenatal Vit-Fe Fumarate-FA (PRENATAL VITAMIN) 27-0.8 MG TABS Take 1 tablet by mouth daily at 6 (six) AM. 100 tablet 0 07/26/2020 at Unknown time  . clindamycin-benzoyl peroxide (BENZACLIN) gel Apply topically 2 (two) times daily. (Patient not taking: Reported on 07/26/2020)   Not Taking at Unknown time  . PROAIR HFA 108 (90 Base) MCG/ACT inhaler USE 2 INHALATIONS EVERY FOUR TO SIX HOURS AS NEEDED (Patient not taking: Reported on 07/26/2020)  5 Not Taking at Unknown time    No Known Allergies  Review of Systems: Negative except for what is mentioned in HPI.  Physical Exam:  BP (!) 120/58 (BP Location: Right Arm)   Pulse 83   Temp 98.4 F (36.9 C) (Oral)   Resp 16   Ht 5\' 3"  (1.6 m)   Wt 108.4 kg   LMP 10/21/2019 (Exact Date)   BMI 42.34 kg/m   GENERAL: Well-developed, well-nourished female in no acute distress.   LUNGS: Clear to auscultation bilaterally.   HEART: Regular rate and rhythm.  ABDOMEN: Soft, nontender, nondistended, gravid.  EXTREMITIES: Nontender, no edema, 2+ distal pulses.  Cervical Exam: Dilation: 4 Effacement (%): 50 Cervical Position: Posterior Station: -3 Presentation: Vertex Exam by:: [email or web address] RN  FHT:  Category I  Contractions: Every three (3) to five (5) minutes; soft resting tone   Pertinent Labs/Studies:   Results for orders placed or performed during the hospital encounter of 07/26/20 (from the past 24 hour(s))  CBC     Status: None   Collection Time: 07/26/20  1:24 AM  Result Value Ref Range   WBC 7.2 4.0 - 10.5 K/uL   RBC 4.15 3.87 - 5.11 MIL/uL   Hemoglobin 12.3 12.0 - 15.0 g/dL   HCT 09/25/20 36 - 46 %   MCV 90.4 80.0 - 100.0 fL   MCH 29.6 26.0 - 34.0 pg   MCHC 32.8 30.0 - 36.0 g/dL   RDW 16.1 09.6 - 04.5 %   Platelets 184 150 - 400 K/uL   nRBC 0.0 0.0 - 0.2 %   Comprehensive metabolic panel     Status: Abnormal   Collection Time: 07/26/20  1:24 AM  Result Value Ref Range   Sodium 138 135 - 145 mmol/L   Potassium 3.8 3.5 - 5.1 mmol/L   Chloride 106 98 - 111 mmol/L   CO2 22 22 - 32 mmol/L   Glucose, Bld  83 70 - 99 mg/dL   BUN 8 6 - 20 mg/dL   Creatinine, Ser 7.82 0.44 - 1.00 mg/dL   Calcium 9.0 8.9 - 95.6 mg/dL   Total Protein 7.2 6.5 - 8.1 g/dL   Albumin 3.2 (L) 3.5 - 5.0 g/dL   AST 19 15 - 41 U/L   ALT 15 0 - 44 U/L   Alkaline Phosphatase 78 38 - 126 U/L   Total Bilirubin 0.5 0.3 - 1.2 mg/dL   GFR calc non Af Amer >60 >60 mL/min   Anion gap 10 5 - 15  Type and screen     Status: None   Collection Time: 07/26/20  1:24 AM  Result Value Ref Range   ABO/RH(D) A POS    Antibody Screen NEG    Sample Expiration      07/29/2020,2359 Performed at [facility], [address] [place], [place] [address]   ABO/Rh     Status: None   Collection Time: 07/26/20  2:40 AM  Result Value Ref Range   ABO/RH(D)      A POS Performed at [facility], [address] [place], [place] [address]     Assessment :  Gabrielle Byrd is a 24 y.o. G1P0000 at [redacted]w[redacted]d being admitted for induction of labor due to morbid obesity, Rh positive, GBS positive, History of anemia, Varicella non-immune  FHR Category I/II  Plan:  Admit to birthing suites, see orders.   Induction/Augmentation as needed, per protocol.   Delivery plan: Hopeful for vaginal birth.   Dr. Logan Bores notified of admission and plan of care. FHR tracing reviewed with MD.    Gunnar Bulla, CNM [facility], [facility] 07/26/20 8:37 AM

## 2020-07-26 NOTE — Anesthesia Preprocedure Evaluation (Signed)
Anesthesia Evaluation  Patient identified by MRN, date of birth, ID band Patient awake    Reviewed: Allergy & Precautions, H&P , NPO status , Patient's Chart, lab work & pertinent test results  Airway Mallampati: III       Dental no notable dental hx.    Pulmonary asthma ,           Cardiovascular      Neuro/Psych    GI/Hepatic   Endo/Other    Renal/GU      Musculoskeletal   Abdominal   Peds  Hematology  (+) Blood dyscrasia, anemia ,   Anesthesia Other Findings   Reproductive/Obstetrics (+) Pregnancy                             Anesthesia Physical Anesthesia Plan  ASA: II  Anesthesia Plan: Epidural   Post-op Pain Management:    Induction:   PONV Risk Score and Plan:   Airway Management Planned:   Additional Equipment:   Intra-op Plan:   Post-operative Plan:   Informed Consent: I have reviewed the patients History and Physical, chart, labs and discussed the procedure including the risks, benefits and alternatives for the proposed anesthesia with the patient or authorized representative who has indicated his/her understanding and acceptance.       Plan Discussed with: Anesthesiologist  Anesthesia Plan Comments:         Anesthesia Quick Evaluation

## 2020-07-26 NOTE — Anesthesia Procedure Notes (Signed)
Epidural Patient location during procedure: OB Start time: 07/26/2020 2:25 PM End time: 07/26/2020 2:46 PM  Staffing Anesthesiologist: Alver Fisher, MD Resident/CRNA: Irving Burton, CRNA Performed: resident/CRNA   Preanesthetic Checklist Completed: patient identified, IV checked, site marked, risks and benefits discussed, surgical consent, monitors and equipment checked, pre-op evaluation and timeout performed  Epidural Patient position: sitting Prep: ChloraPrep Patient monitoring: heart rate, continuous pulse ox and blood pressure Approach: midline Location: L3-L4 Injection technique: LOR saline  Needle:  Needle type: Tuohy  Needle gauge: 17 G Needle length: 9 cm and 9 Needle insertion depth: 8 cm Catheter type: closed end flexible Catheter size: 19 Gauge Catheter at skin depth: 13 cm Test dose: negative and 1.5% lidocaine with Epi 1:200 K  Assessment Sensory level: T10 Events: blood not aspirated, injection not painful, no injection resistance, no paresthesia and negative IV test  Additional Notes 1 attempt Pt. Evaluated and documentation done after procedure finished. Patient identified. Risks/Benefits/Options discussed with patient including but not limited to bleeding, infection, nerve damage, paralysis, failed block, incomplete pain control, headache, blood pressure changes, nausea, vomiting, reactions to medication both or allergic, itching and postpartum back pain. Confirmed with bedside nurse the patient's most recent platelet count. Confirmed with patient that they are not currently taking any anticoagulation, have any bleeding history or any family history of bleeding disorders. Patient expressed understanding and wished to proceed. All questions were answered. Sterile technique was used throughout the entire procedure. Please see nursing notes for vital signs. Test dose was given through epidural catheter and negative prior to continuing to dose epidural or start  infusion. Warning signs of high block given to the patient including shortness of breath, tingling/numbness in hands, complete motor block, or any concerning symptoms with instructions to call for help. Patient was given instructions on fall risk and not to get out of bed. All questions and concerns addressed with instructions to call with any issues or inadequate analgesia.   Patient tolerated the insertion well without immediate complications.Reason for block:procedure for pain

## 2020-07-26 NOTE — Progress Notes (Signed)
Patient ID: Gabrielle Byrd, female   DOB: 11-22-1995, 24 y.o.   MRN: [id number]  Gabrielle Byrd is a 24 y.o. G1P0000 at [redacted]w[redacted]d by LMP admitted for induction of labor due to morbid obesity in pregnancy.  Subjective:  Patient sitting in beds, reporting irregular contractions that feel like abdominal cramping. Pain: 6/10. FOB at bedside for continuous labor support.   Denies difficulty breathing or respiratory distress, chest pain, dysuria, and leg pain.   Objective:  Temp:  [98.4 F (36.9 C)-99.1 F (37.3 C)] 99.1 F (37.3 C) (10/08 1158) Pulse Rate:  [83-91] 90 (10/08 1158) Resp:  [16-18] 16 (10/08 1158) BP: (120-126)/(47-70) 126/47 (10/08 1158) Weight:  [108.4 kg-108.5 kg] 108.4 kg (10/08 0029)  Fetal Wellbeing:  Category II  UC:   irregular, every one (1) to four (4) minutes; soft resting tone  SVE:   Dilation: 5.5 Effacement (%): 60 Station: -2 Exam by:: Gabrielle Byrd CNM  Labs: Lab Results  Component Value Date   WBC 7.2 07/26/2020   HGB 12.3 07/26/2020   HCT 37.5 07/26/2020   MCV 90.4 07/26/2020   PLT 184 07/26/2020    Assessment:  Gabrielle Byrd is a 24 y.o. G1P0000 at [redacted]w[redacted]d admitted for induction of labor due to morbid obesity, Rh positive, GBS positive, History of anemia, Varicella non-immune  FHR Category I/II  Plan:  AROM without difficulty, small amount of clear fluid.   Encouraged position change and use of peanut ball.   Reviewed red flag symptoms and when to call.   Continue orders as written. Reassess as needed.    Gabrielle Byrd  [facility], [facility] 07/26/2020, 12:36 PM

## 2020-07-27 ENCOUNTER — Encounter: Payer: Self-pay | Admitting: Certified Nurse Midwife

## 2020-07-27 LAB — CBC
HCT: 33.6 % — ABNORMAL LOW (ref 36.0–46.0)
Hemoglobin: 11.3 g/dL — ABNORMAL LOW (ref 12.0–15.0)
MCH: 30.3 pg (ref 26.0–34.0)
MCHC: 33.6 g/dL (ref 30.0–36.0)
MCV: 90.1 fL (ref 80.0–100.0)
Platelets: 184 10*3/uL (ref 150–400)
RBC: 3.73 MIL/uL — ABNORMAL LOW (ref 3.87–5.11)
RDW: 13.7 % (ref 11.5–15.5)
WBC: 10.7 10*3/uL — ABNORMAL HIGH (ref 4.0–10.5)
nRBC: 0 % (ref 0.0–0.2)

## 2020-07-27 MED ORDER — DIBUCAINE (PERIANAL) 1 % EX OINT: 1.0000 "application " | TOPICAL_OINTMENT | CUTANEOUS | Status: DC | PRN

## 2020-07-27 MED ORDER — ONDANSETRON HCL 4 MG/2ML IJ SOLN: 4.0000 mg | INTRAMUSCULAR | Status: DC | PRN

## 2020-07-27 MED ORDER — SODIUM CHLORIDE 0.9 % IV SOLN: 250.0000 mL | INTRAVENOUS | Status: DC | PRN

## 2020-07-27 MED ORDER — PRENATAL MULTIVITAMIN CH
1.0000 | ORAL_TABLET | Freq: Every day | ORAL | Status: DC
  Administered 2020-07-27 – 2020-07-28 (×2): 1 via ORAL
  Filled 2020-07-27 (×2): qty 1

## 2020-07-27 MED ORDER — ONDANSETRON HCL 4 MG PO TABS
4.0000 mg | ORAL_TABLET | ORAL | Status: DC | PRN
  Filled 2020-07-27: qty 1

## 2020-07-27 MED ORDER — SENNOSIDES-DOCUSATE SODIUM 8.6-50 MG PO TABS
2.0000 | ORAL_TABLET | ORAL | Status: DC
  Administered 2020-07-27 (×2): 2 via ORAL
  Filled 2020-07-27 (×2): qty 2

## 2020-07-27 MED ORDER — DIPHENHYDRAMINE HCL 25 MG PO CAPS: 25.0000 mg | ORAL_CAPSULE | Freq: Four times a day (QID) | ORAL | Status: DC | PRN

## 2020-07-27 MED ORDER — WITCH HAZEL-GLYCERIN EX PADS: 1.0000 "application " | MEDICATED_PAD | CUTANEOUS | Status: DC | PRN

## 2020-07-27 MED ORDER — SODIUM CHLORIDE 0.9% FLUSH: 3.0000 mL | Freq: Two times a day (BID) | INTRAVENOUS | Status: DC

## 2020-07-27 MED ORDER — METHYLERGONOVINE MALEATE 0.2 MG PO TABS
0.2000 mg | ORAL_TABLET | ORAL | Status: DC | PRN
  Filled 2020-07-27: qty 1

## 2020-07-27 MED ORDER — FERROUS SULFATE 325 (65 FE) MG PO TABS
325.0000 mg | ORAL_TABLET | Freq: Two times a day (BID) | ORAL | Status: DC
  Administered 2020-07-27 – 2020-07-28 (×3): 325 mg via ORAL
  Filled 2020-07-27 (×3): qty 1

## 2020-07-27 MED ORDER — SIMETHICONE 80 MG PO CHEW: 80.0000 mg | CHEWABLE_TABLET | ORAL | Status: DC | PRN

## 2020-07-27 MED ORDER — COCONUT OIL OIL
1.0000 "application " | TOPICAL_OIL | Status: DC | PRN
  Filled 2020-07-27: qty 120

## 2020-07-27 MED ORDER — SODIUM CHLORIDE 0.9% FLUSH: 3.0000 mL | INTRAVENOUS | Status: DC | PRN

## 2020-07-27 MED ORDER — METHYLERGONOVINE MALEATE 0.2 MG/ML IJ SOLN: 0.2000 mg | INTRAMUSCULAR | Status: DC | PRN

## 2020-07-27 NOTE — Anesthesia Postprocedure Evaluation (Signed)
Anesthesia Post Note  Patient: Data processing manager  Procedure(s) Performed: AN AD HOC LABOR EPIDURAL  Patient location during evaluation: Mother Baby Anesthesia Type: Epidural Level of consciousness: awake and alert Pain management: pain level controlled Vital Signs Assessment: post-procedure vital signs reviewed and stable Respiratory status: spontaneous breathing, nonlabored ventilation and respiratory function stable Cardiovascular status: stable Postop Assessment: no headache, able to ambulate, adequate PO intake and no apparent nausea or vomiting Anesthetic complications: no Comments: Patient does state she has some soreness of her back at the site of epidural placement.   No complications documented.   Last Vitals:  Vitals:   07/27/20 0821 07/27/20 1205  BP: 112/68 (!) 121/58  Pulse: 85 73  Resp: 18 18  Temp: 37 C 37.2 C  SpO2: 100% 100%    Last Pain:  Vitals:   07/27/20 1205  TempSrc: Oral  PainSc:                  Lenard Simmer

## 2020-07-27 NOTE — Anesthesia Post-op Follow-up Note (Signed)
  Anesthesia Pain Follow-up Note  Patient: Gabrielle Byrd  Day #: 1  Date of Follow-up: 07/27/2020 Time: 1:05 PM  Last Vitals:  Vitals:   07/27/20 0821 07/27/20 1205  BP: 112/68 (!) 121/58  Pulse: 85 73  Resp: 18 18  Temp: 37 C 37.2 C  SpO2: 100% 100%    Level of Consciousness: alert  Pain: mild   Side Effects:None  Catheter Site Exam:clean, dry     Plan: D/C from anesthesia care at surgeon's request  Lenard Simmer

## 2020-07-27 NOTE — Plan of Care (Addendum)
Alert and oriented with pleasant affect. Color good, skin w&d. Assessment and VS WNL. Demonstrating ability to care for self and Infant. Denies c/o. Patient declines Flu Shot this admission.

## 2020-07-27 NOTE — Progress Notes (Signed)
Patient ID: Gabrielle Byrd, female   DOB: [birth year], 24 y.o.   MRN: [id number]  Post Partum Day # 1, s/p spontaneous vaginal birth after induction due to morbid obesity, Rh positive, GBS positive, History anemia, Varicella non-immune  Subjective:  Patient resting quietly in bed, infant sleeping in bassinet at bedside. Reports back pain.   FOB in room for continuous labor support.   Denies difficulty breathing or respiratory distress, chest pain, abdominal pain, excessive vaginal bleeding, dysuria, and leg pain.   Objective:  Temp:  [97.8 F (36.6 C)-99.1 F (37.3 C)] 98.3 F (36.8 C) (10/09 0408) Pulse Rate:  [74-110] 74 (10/09 0408) Resp:  [16-20] 20 (10/09 0408) BP: (105-144)/(47-97) 117/69 (10/09 0408) SpO2:  [95 %-100 %] 99 % (10/09 0408)  Physical Exam:   General: alert and cooperative   Lungs: clear to auscultation bilaterally  Breasts: deferred, no complaints  Heart: normal apical impulse  Abdomen: soft, non-tender; bowel sounds normal; no masses,  no organomegaly  Pelvis: Lochia: appropriate, Uterine Fundus: firm  Extremities: DVT Evaluation: No evidence of DVT seen on physical exam.  Recent Labs    07/26/20 0124 07/27/20 0402  HGB 12.3 11.3*  HCT 37.5 33.6*    Assessment:  24 year old female G1P1, Post Partum Day # 1, s/p spontaneous vaginal birth after induction due to morbid obesity, Rh positive, GBS positive, History anemia, Varicella non-immune  Breastfeeding with formula supplementation  Plan:  Routine postpartum education and care.   Lactation consult as needed.   Plans newborn circumcision prior to discharge.   Reviewed red flag symptoms and when to call.   Continue orders as written. Reassess as needed.   Anticipate discharge tomorrow.    LOS: 1 day     Gunnar Bulla, CNM [facility], [facility] 07/27/2020 8:14 AM

## 2020-07-28 ENCOUNTER — Ambulatory Visit: Payer: Self-pay

## 2020-07-28 MED ORDER — IBUPROFEN 600 MG PO TABS: 600.0000 mg | ORAL_TABLET | Freq: Four times a day (QID) | ORAL | 0 refills | Status: DC

## 2020-07-28 NOTE — Discharge Instructions (Signed)
Contraception Choices Contraception, also called birth control, means things to use or ways to try not to get pregnant. Hormonal birth control This kind of birth control uses hormones. Here are some types of hormonal birth control:  A tube that is put under skin of the arm (implant). The tube can stay in for as long as 3 years.  Shots to get every 3 months (injections).  Pills to take every day (birth control pills).  A patch to change 1 time each week for 3 weeks (birth control patch). After that, the patch is taken off for 1 week.  A ring to put in the vagina. The ring is left in for 3 weeks. Then it is taken out of the vagina for 1 week. Then a new ring is put in.  Pills to take after unprotected sex (emergency birth control pills). Barrier birth control Here are some types of barrier birth control:  A thin covering that is put on the penis before sex (female condom). The covering is thrown away after sex.  A soft, loose covering that is put in the vagina before sex (female condom). The covering is thrown away after sex.  A rubber bowl that sits over the cervix (diaphragm). The bowl must be made for you. The bowl is put into the vagina before sex. The bowl is left in for 6-8 hours after sex. It is taken out within 24 hours.  A small, soft cup that fits over the cervix (cervical cap). The cup must be made for you. The cup can be left in for 6-8 hours after sex. It is taken out within 48 hours.  A sponge that is put into the vagina before sex. It must be left in for at least 6 hours after sex. It must be taken out within 30 hours. Then it is thrown away.  A chemical that kills or stops sperm from getting into the uterus (spermicide). It may be a pill, cream, jelly, or foam to put in the vagina. The chemical should be used at least 10-15 minutes before sex. IUD (intrauterine) birth control An IUD is a small, T-shaped piece of plastic. It is put inside the uterus. There are two  kinds:  Hormone IUD. This kind can stay in for 3-5 years.  Copper IUD. This kind can stay in for 10 years. Permanent birth control Here are some types of permanent birth control:  Surgery to block the fallopian tubes.  Having an insert put into each fallopian tube.  Surgery to tie off the tubes that carry sperm (vasectomy). Natural planning birth control Here are some types of natural planning birth control:  Not having sex on the days the woman could get pregnant.  Using a calendar: ? To keep track of the length of each period. ? To find out what days pregnancy can happen. ? To plan to not have sex on days when pregnancy can happen.  Watching for symptoms of ovulation and not having sex during ovulation. One way the woman can check for ovulation is to check her temperature.  Waiting to have sex until after ovulation. Summary  Contraception, also called birth control, means things to use or ways to try not to get pregnant.  Hormonal methods of birth control include implants, injections, pills, patches, vaginal rings, and emergency birth control pills.  Barrier methods of birth control can include female condoms, female condoms, diaphragms, cervical caps, sponges, and spermicides.  There are two types of IUD (intrauterine device)  An IUD can be put in a woman's uterus to prevent pregnancy for 3-5 years.  Permanent sterilization can be done through a procedure for males, females, or both.  Natural planning methods involve not having sex on the days when the woman could get pregnant. This information is not intended to replace advice given to you by your health care provider. Make sure you discuss any questions you have with your health care provider. Document Revised: 01/25/2019 Document Reviewed: 10/15/2016 Elsevier Patient Education  2020 [employer]   Care of a Perineal Tear A perineal tear is a cut or tear (laceration) in the tissue between the opening of  the vagina and the anus (perineum). Some women develop a perineal tear during a vaginal birth. This can happen as the baby emerges from the birth canal and the perineum is stretched. There are four degrees of perineal tears based on how deep and long the laceration is:  First degree. This involves a shallow tear at the edge of the vaginal opening that extends slightly into the perineal skin.  Second degree. This involves tearing described in first degree perineal tear, and an additional deeper tear of the vaginal opening and perineal tissues. It may also include tearing of a muscle just under the perineal skin.  Third degree. This involves tearing described in first and second degree perineal tears, with the addition that tearing in the third degree extends into the muscle of the anus (anal sphincter).  Fourth degree. This involves all levels of tears described in first, second, and third degree perineal tears, with the tear in the fourth degree extending into the rectum. First and second degree perineal tears may or may not be stitched closed, depending on their location and appearance. Third and fourth degree perineal tears are stitched closed immediately after the baby's birth. What are the risks? Depending on the type of perineal tear you have, you may be at risk for:  Bleeding.  Developing a collection of blood in the perineal tear area (hematoma).  Pain. This may include pain when you urinate, or pain when you have a bowel movement.  Infection at the site of the tear.  Fever.  Trouble controlling your urination or bowels (incontinence).  Painful sex. How to care for a perineal tear Wound care  Take a sitz bath as told by your health care provider. A sitz bath is a warm water bath that is taken while you are sitting down. The water should only come up to your hips and should cover your buttocks. This can speed up healing. 1. Partially fill a bathtub with warm water. You will only  need the water to be deep enough to cover your hips and buttocks when you are sitting in it. 2. If your health care provider told you to put medicine in the water, follow the directions exactly as told. 3. Sit in the water and open the tub drain a little. 4. Turn on the warm water again to keep the tub at the correct level. Keep the water running constantly. 5. Soak in the water for 15-20 minutes or as told by your health care provider. 6. After the sitz bath, pat the affected area dry first. Do not rub it. 7. Be careful when you stand up after the sitz bath because you may feel dizzy.  Wash your hands before and after applying medicine to the area.  Wear a sanitary pad as told by your health care provider. Change the pad as often as   told by your health care provider.  Leave stitches (sutures), skin glue, or adhesive strips in place. These skin closures may need to stay in place for 2 weeks or longer. If adhesive strip edges start to loosen and curl up, you may trim the loose edges. Do not remove adhesive strips completely unless your health care provider tells you to do that.  Check your wound every day for signs of infection. Check for: ? Redness, swelling, or pain. ? Fluid or blood. ? Warmth. ? Pus or a bad smell. Managing pain  If directed, put ice on the painful area: ? Put ice in a plastic bag. ? Place a towel between your skin and the bag. ? Leave the ice on for 20 minutes, 2-3 times a day.  Apply a numbing spray to the perineal tear site as told by your health care provider. This may help with discomfort.  Take and apply over-the-counter and prescription medicines only as told by your health care provider.  If told, put about 3 witch hazel-containing hemorrhoid treatment pads on top of your sanitary pad. The witch hazel in the hemorrhoid pads helps with swelling and discomfort.  Sit on an inflatable ring or pillow. This may provide comfort. General instructions  Squeeze  warm water on your perineum after urinating. This should be done from front to back with a squeeze bottle. Pat the area to dry it.  Do not have sex, use tampons, or place anything in your vagina for at least 6 weeks or as told by your health care provider.  Keep all follow-up visits as told by your health care provider. These include any postpartum visits. This is important. Contact a health care provider if:  Your pain is not relieved with medicines.  You have painful urination.  You have redness, swelling, or pain around your tear.  You have fluid or blood coming from your tear.  Your tear feels warm to the touch.  You have pus or a bad smell coming from your tear.  You have a fever. Get help right away if:  Your tear opens.  You cannot urinate.  You have an increase in bleeding.  You have severe pain. Summary  A perineal tear is a cut or tear (laceration) in the tissue between the opening of the vagina and the anus (perineum).  There are four degrees of perineal tears based on how deep and long the laceration is.  First and second-degree perineal tears may or may not be stitched closed, depending on their location and appearance. Third and fourth- degree perineal tears are stitched closed immediately after the baby's birth.  Follow your health care provider's instructions for caring for your perineal tear. Know how to manage pain and how to care for your wound. Know when to call your health care provider and when to seek immediate emergency care. This information is not intended to replace advice given to you by your health care provider. Make sure you discuss any questions you have with your health care provider. Document Revised: 09/17/2017 Document Reviewed: 11/09/2016 Elsevier Patient Education  2020 [employer]   Postpartum Care After Vaginal Delivery This sheet gives you information about how to care for yourself from the time you deliver your baby to up to  6-12 weeks after delivery (postpartum period). Your health care provider may also give you more specific instructions. If you have problems or questions, contact your health care provider. Follow these instructions at home: Vaginal bleeding  It is normal   to have vaginal bleeding (lochia) after delivery. Wear a sanitary pad for vaginal bleeding and discharge. ? During the first week after delivery, the amount and appearance of lochia is often similar to a menstrual period. ? Over the next few weeks, it will gradually decrease to a dry, yellow-brown discharge. ? For most women, lochia stops completely by 4-6 weeks after delivery. Vaginal bleeding can vary from woman to woman.  Change your sanitary pads frequently. Watch for any changes in your flow, such as: ? A sudden increase in volume. ? A change in color. ? Large blood clots.  If you pass a blood clot from your vagina, save it and call your health care provider to discuss. Do not flush blood clots down the toilet before talking with your health care provider.  Do not use tampons or douches until your health care provider says this is safe.  If you are not breastfeeding, your period should return 6-8 weeks after delivery. If you are feeding your child breast milk only (exclusive breastfeeding), your period may not return until you stop breastfeeding. Perineal care  Keep the area between the vagina and the anus (perineum) clean and dry as told by your health care provider. Use medicated pads and pain-relieving sprays and creams as directed.  If you had a cut in the perineum (episiotomy) or a tear in the vagina, check the area for signs of infection until you are healed. Check for: ? More redness, swelling, or pain. ? Fluid or blood coming from the cut or tear. ? Warmth. ? Pus or a bad smell.  You may be given a squirt bottle to use instead of wiping to clean the perineum area after you go to the bathroom. As you start healing, you may  use the squirt bottle before wiping yourself. Make sure to wipe gently.  To relieve pain caused by an episiotomy, a tear in the vagina, or swollen veins in the anus (hemorrhoids), try taking a warm sitz bath 2-3 times a day. A sitz bath is a warm water bath that is taken while you are sitting down. The water should only come up to your hips and should cover your buttocks. Breast care  Within the first few days after delivery, your breasts may feel heavy, full, and uncomfortable (breast engorgement). Milk may also leak from your breasts. Your health care provider can suggest ways to help relieve the discomfort. Breast engorgement should go away within a few days.  If you are breastfeeding: ? Wear a bra that supports your breasts and fits you well. ? Keep your nipples clean and dry. Apply creams and ointments as told by your health care provider. ? You may need to use breast pads to absorb milk that leaks from your breasts. ? You may have uterine contractions every time you breastfeed for up to several weeks after delivery. Uterine contractions help your uterus return to its normal size. ? If you have any problems with breastfeeding, work with your health care provider or lactation consultant.  If you are not breastfeeding: ? Avoid touching your breasts a lot. Doing this can make your breasts produce more milk. ? Wear a good-fitting bra and use cold packs to help with swelling. ? Do not squeeze out (express) milk. This causes you to make more milk. Intimacy and sexuality  Ask your health care provider when you can engage in sexual activity. This may depend on: ? Your risk of infection. ? How fast you are healing. ? Your   Your comfort and desire to engage in sexual activity.  You are able to get pregnant after delivery, even if you have not had your period. If desired, talk with your health care provider about methods of birth control (contraception). Medicines  Take over-the-counter and  prescription medicines only as told by your health care provider.  If you were prescribed an antibiotic medicine, take it as told by your health care provider. Do not stop taking the antibiotic even if you start to feel better. Activity  Gradually return to your normal activities as told by your health care provider. Ask your health care provider what activities are safe for you.  Rest as much as possible. Try to rest or take a nap while your baby is sleeping. Eating and drinking   Drink enough fluid to keep your urine pale yellow.  Eat high-fiber foods every day. These may help prevent or relieve constipation. High-fiber foods include: ? Whole grain cereals and breads. ? Brown rice. ? Beans. ? Fresh fruits and vegetables.  Do not try to lose weight quickly by cutting back on calories.  Take your prenatal vitamins until your postpartum checkup or until your health care provider tells you it is okay to stop. Lifestyle  Do not use any products that contain nicotine or tobacco, such as cigarettes and e-cigarettes. If you need help quitting, ask your health care provider.  Do not drink alcohol, especially if you are breastfeeding. General instructions  Keep all follow-up visits for you and your baby as told by your health care provider. Most women visit their health care provider for a postpartum checkup within the first 3-6 weeks after delivery. Contact a health care provider if:  You feel unable to cope with the changes that your child brings to your life, and these feelings do not go away.  You feel unusually sad or worried.  Your breasts become red, painful, or hard.  You have a fever.  You have trouble holding urine or keeping urine from leaking.  You have little or no interest in activities you used to enjoy.  You have not breastfed at all and you have not had a menstrual period for 12 weeks after delivery.  You have stopped breastfeeding and you have not had a  menstrual period for 12 weeks after you stopped breastfeeding.  You have questions about caring for yourself or your baby.  You pass a blood clot from your vagina. Get help right away if:  You have chest pain.  You have difficulty breathing.  You have sudden, severe leg pain.  You have severe pain or cramping in your lower abdomen.  You bleed from your vagina so much that you fill more than one sanitary pad in one hour. Bleeding should not be heavier than your heaviest period.  You develop a severe headache.  You faint.  You have blurred vision or spots in your vision.  You have bad-smelling vaginal discharge.  You have thoughts about hurting yourself or your baby. If you ever feel like you may hurt yourself or others, or have thoughts about taking your own life, get help right away. You can go to the nearest emergency department or call:  Your local emergency services (911 in the [place]  A suicide crisis helpline, such as the National Suicide Prevention Lifeline at 779-831-8894. This is open 24 hours a day. Summary  The period of time right after you deliver your newborn up to 6-12 weeks after delivery is called the postpartum  Gradually return to your normal activities as told by your health care provider.  Keep all follow-up visits for you and your baby as told by your health care provider. This information is not intended to replace advice given to you by your health care provider. Make sure you discuss any questions you have with your health care provider. Document Revised: 10/08/2017 Document Reviewed: 07/19/2017 Elsevier Patient Education  2020 [employer]   Postpartum Baby Blues The postpartum period begins right after the birth of a baby. During this time, there is often a lot of joy and excitement. It is also a time of many changes in the life of the parents. No matter how many times a mother gives birth, each child brings new challenges to the  family, including different ways of relating to one another. It is common to have feelings of excitement along with confusing changes in moods, emotions, and thoughts. You may feel happy one minute and sad or stressed the next. These feelings of sadness usually happen in the period right after you have your baby, and they go away within a week or two. This is called the "baby blues." What are the causes? There is no known cause of baby blues. It is likely caused by a combination of factors. However, changes in hormone levels after childbirth are believed to trigger some of the symptoms. Other factors that can play a role in these mood changes include:  Lack of sleep.  Stressful life events, such as poverty, caring for a loved one, or death of a loved one.  Genetics. What are the signs or symptoms? Symptoms of this condition include:  Brief changes in mood, such as going from extreme happiness to sadness.  Decreased concentration.  Difficulty sleeping.  Crying spells and tearfulness.  Loss of appetite.  Irritability.  Anxiety. If the symptoms of baby blues last for more than 2 weeks or become more severe, you may have postpartum depression. How is this diagnosed? This condition is diagnosed based on an evaluation of your symptoms. There are no medical or lab tests that lead to a diagnosis, but there are various questionnaires that a health care provider may use to identify women with the baby blues or postpartum depression. How is this treated? Treatment is not needed for this condition. The baby blues usually go away on their own in 1-2 weeks. Social support is often all that is needed. You will be encouraged to get adequate sleep and rest. Follow these instructions at home: Lifestyle      Get as much rest as you can. Take a nap when the baby sleeps.  Exercise regularly as told by your health care provider. Some women find yoga and walking to be helpful.  Eat a balanced and  nourishing diet. This includes plenty of fruits and vegetables, whole grains, and lean proteins.  Do little things that you enjoy. Have a cup of tea, take a bubble bath, read your favorite magazine, or listen to your favorite music.  Avoid alcohol.  Ask for help with household chores, cooking, grocery shopping, or running errands. Do not try to do everything yourself. Consider hiring a postpartum doula to help. This is a professional who specializes in providing support to new mothers.  Try not to make any major life changes during pregnancy or right after giving birth. This can add stress. General instructions  Talk to people close to you about how you are feeling. Get support from your partner, family members, friends, or   other new moms. You may want to join a support group.  Find ways to cope with stress. This may include: ? Writing your thoughts and feelings in a journal. ? Spending time outside. ? Spending time with people who make you laugh.  Try to stay positive in how you think. Think about the things you are grateful for.  Take over-the-counter and prescription medicines only as told by your health care provider.  Let your health care provider know if you have any concerns.  Keep all postpartum visits as told by your health care provider. This is important. Contact a health care provider if:  Your baby blues do not go away after 2 weeks. Get help right away if:  You have thoughts of taking your own life (suicidal thoughts).  You think you may harm the baby or other people.  You see or hear things that are not there (hallucinations). Summary  After giving birth, you may feel happy one minute and sad or stressed the next. Feelings of sadness that happen right after the baby is born and go away after a week or two are called the "baby blues."  You can manage the baby blues by getting enough rest, eating a healthy diet, exercising, spending time with supportive people, and  finding ways to cope with stress.  If feelings of sadness and stress last longer than 2 weeks or get in the way of caring for your baby, talk to your health care provider. This may mean you have postpartum depression. This information is not intended to replace advice given to you by your health care provider. Make sure you discuss any questions you have with your health care provider. Document Revised: 01/27/2019 Document Reviewed: 12/01/2016 Elsevier Patient Education  2020 [employer] Inc[employer]  

## 2020-07-28 NOTE — Lactation Note (Addendum)
This note was copied from a baby's chart. Lactation Consultation Note  Patient Name: Gabrielle Byrd NOMVE'H Date: 07/28/2020   Parents have been giving mostly bottles of formula since delivering.  When questioned mom about feeding choice, she says she wants to breast feed and formula feed.  Have assisted with a few breast feeds.  Have been able to get him to latch with a few sucks before falling asleep at the breast.  Explained risks of not stimulating her breasts frequently to tell her body to bring in the mature milk and how it will affect milk supply.  Mom did agree to start pumping yesterday, but has not been consistently pumping. Mom says she has been putting [name] to the breast some when he gives cues and pumping for stimulation.  Mom and [name] being discharged today.  Demonstrated how to use the manual pump to keep stimulating if she continues to have difficulty latching him on her own.  Mom reports nipples getting tender from trying.  Coconut oil and comfort gels given with instructions in alternating use.  Hand out has already been given on what to expect with feeding the first 4 days of life reviewing normal newborn stomach size, adequate intake and out put, supply and demand, normal course of lactation and routine newborn feeding cues.  Lactation [employer] given and reviewed encouraging mom to call with any questions, concerns or assistance.   Maternal Data    Feeding    LATCH Score                   Interventions    Lactation Tools Discussed/Used     Consult Status      Louis Meckel 07/28/2020, 9:07 PM
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
## 2020-07-28 NOTE — Progress Notes (Signed)
Patient discharged home with infant. Discharge instructions, prescriptions and follow up appointment given to and reviewed with patient. Patient verbalized understanding  

## 2020-07-28 NOTE — Progress Notes (Signed)
   07/28/20 1245  Clinical Encounter Type  Visited With Patient and family together  Visit Type Initial;Spiritual support;Social support  Referral From Nurse  Consult/Referral To Chaplain  Visited with Pt per OR from the nurse. The proud baby father was in the room. Went over the AD. The Pt said she will take the AD home and look over it. CH will follow-up.

## 2020-08-09 ENCOUNTER — Ambulatory Visit (INDEPENDENT_AMBULATORY_CARE_PROVIDER_SITE_OTHER): Payer: Medicaid Other | Admitting: Certified Nurse Midwife

## 2020-08-09 ENCOUNTER — Encounter: Payer: Self-pay | Admitting: Certified Nurse Midwife

## 2020-08-09 ENCOUNTER — Other Ambulatory Visit: Payer: Self-pay

## 2020-08-09 DIAGNOSIS — Z3009 Encounter for other general counseling and advice on contraception: Secondary | ICD-10-CM

## 2020-08-09 DIAGNOSIS — Z1331 Encounter for screening for depression: Secondary | ICD-10-CM

## 2020-08-09 NOTE — Patient Instructions (Signed)
Care of a Perineal Tear A perineal tear is a cut or tear (laceration) in the tissue between the opening of the vagina and the anus (perineum). Some women develop a perineal tear during a vaginal birth. This can happen as the baby emerges from the birth canal and the perineum is stretched. There are four degrees of perineal tears based on how deep and long the laceration is:  First degree. This involves a shallow tear at the edge of the vaginal opening that extends slightly into the perineal skin.  Second degree. This involves tearing described in first degree perineal tear, and an additional deeper tear of the vaginal opening and perineal tissues. It may also include tearing of a muscle just under the perineal skin.  Third degree. This involves tearing described in first and second degree perineal tears, with the addition that tearing in the third degree extends into the muscle of the anus (anal sphincter).  Fourth degree. This involves all levels of tears described in first, second, and third degree perineal tears, with the tear in the fourth degree extending into the rectum. First and second degree perineal tears may or may not be stitched closed, depending on their location and appearance. Third and fourth degree perineal tears are stitched closed immediately after the baby's birth. What are the risks? Depending on the type of perineal tear you have, you may be at risk for:  Bleeding.  Developing a collection of blood in the perineal tear area (hematoma).  Pain. This may include pain when you urinate, or pain when you have a bowel movement.  Infection at the site of the tear.  Fever.  Trouble controlling your urination or bowels (incontinence).  Painful sex. How to care for a perineal tear Wound care  Take a sitz bath as told by your health care provider. A sitz bath is a warm water bath that is taken while you are sitting down. The water should only come up to your hips and should  cover your buttocks. This can speed up healing. 1. Partially fill a bathtub with warm water. You will only need the water to be deep enough to cover your hips and buttocks when you are sitting in it. 2. If your health care provider told you to put medicine in the water, follow the directions exactly as told. 3. Sit in the water and open the tub drain a little. 4. Turn on the warm water again to keep the tub at the correct level. Keep the water running constantly. 5. Soak in the water for 15-20 minutes or as told by your health care provider. 6. After the sitz bath, pat the affected area dry first. Do not rub it. 7. Be careful when you stand up after the sitz bath because you may feel dizzy.  Wash your hands before and after applying medicine to the area.  Wear a sanitary pad as told by your health care provider. Change the pad as often as told by your health care provider.  Leave stitches (sutures), skin glue, or adhesive strips in place. These skin closures may need to stay in place for 2 weeks or longer. If adhesive strip edges start to loosen and curl up, you may trim the loose edges. Do not remove adhesive strips completely unless your health care provider tells you to do that.  Check your wound every day for signs of infection. Check for: ? Redness, swelling, or pain. ? Fluid or blood. ? Warmth. ? Pus or a bad  smell. Managing pain  If directed, put ice on the painful area: ? Put ice in a plastic bag. ? Place a towel between your skin and the bag. ? Leave the ice on for 20 minutes, 2-3 times a day.  Apply a numbing spray to the perineal tear site as told by your health care provider. This may help with discomfort.  Take and apply over-the-counter and prescription medicines only as told by your health care provider.  If told, put about 3 witch hazel-containing hemorrhoid treatment pads on top of your sanitary pad. The witch hazel in the hemorrhoid pads helps with swelling and  discomfort.  Sit on an inflatable ring or pillow. This may provide comfort. General instructions  Squeeze warm water on your perineum after urinating. This should be done from front to back with a squeeze bottle. Pat the area to dry it.  Do not have sex, use tampons, or place anything in your vagina for at least 6 weeks or as told by your health care provider.  Keep all follow-up visits as told by your health care provider. These include any postpartum visits. This is important. Contact a health care provider if:  Your pain is not relieved with medicines.  You have painful urination.  You have redness, swelling, or pain around your tear.  You have fluid or blood coming from your tear.  Your tear feels warm to the touch.  You have pus or a bad smell coming from your tear.  You have a fever. Get help right away if:  Your tear opens.  You cannot urinate.  You have an increase in bleeding.  You have severe pain. Summary  A perineal tear is a cut or tear (laceration) in the tissue between the opening of the vagina and the anus (perineum).  There are four degrees of perineal tears based on how deep and long the laceration is.  First and second-degree perineal tears may or may not be stitched closed, depending on their location and appearance. Third and fourth- degree perineal tears are stitched closed immediately after the baby's birth.  Follow your health care provider's instructions for caring for your perineal tear. Know how to manage pain and how to care for your wound. Know when to call your health care provider and when to seek immediate emergency care. This information is not intended to replace advice given to you by your health care provider. Make sure you discuss any questions you have with your health care provider. Document Revised: 09/17/2017 Document Reviewed: 11/09/2016 Elsevier Patient Education  2020 [employer].   Contraception Choices Contraception,  also called birth control, means things to use or ways to try not to get pregnant. Hormonal birth control This kind of birth control uses hormones. Here are some types of hormonal birth control:  A tube that is put under skin of the arm (implant). The tube can stay in for as long as 3 years.  Shots to get every 3 months (injections).  Pills to take every day (birth control pills).  A patch to change 1 time each week for 3 weeks (birth control patch). After that, the patch is taken off for 1 week.  A ring to put in the vagina. The ring is left in for 3 weeks. Then it is taken out of the vagina for 1 week. Then a new ring is put in.  Pills to take after unprotected sex (emergency birth control pills). Barrier birth control Here are some types of barrier  birth control:  A thin covering that is put on the penis before sex (female condom). The covering is thrown away after sex.  A soft, loose covering that is put in the vagina before sex (female condom). The covering is thrown away after sex.  A rubber bowl that sits over the cervix (diaphragm). The bowl must be made for you. The bowl is put into the vagina before sex. The bowl is left in for 6-8 hours after sex. It is taken out within 24 hours.  A small, soft cup that fits over the cervix (cervical cap). The cup must be made for you. The cup can be left in for 6-8 hours after sex. It is taken out within 48 hours.  A sponge that is put into the vagina before sex. It must be left in for at least 6 hours after sex. It must be taken out within 30 hours. Then it is thrown away.  A chemical that kills or stops sperm from getting into the uterus (spermicide). It may be a pill, cream, jelly, or foam to put in the vagina. The chemical should be used at least 10-15 minutes before sex. IUD (intrauterine) birth control An IUD is a small, T-shaped piece of plastic. It is put inside the uterus. There are two kinds:  Hormone IUD. This kind can stay in for  3-5 years.  Copper IUD. This kind can stay in for 10 years. Permanent birth control Here are some types of permanent birth control:  Surgery to block the fallopian tubes.  Having an insert put into each fallopian tube.  Surgery to tie off the tubes that carry sperm (vasectomy). Natural planning birth control Here are some types of natural planning birth control:  Not having sex on the days the woman could get pregnant.  Using a calendar: ? To keep track of the length of each period. ? To find out what days pregnancy can happen. ? To plan to not have sex on days when pregnancy can happen.  Watching for symptoms of ovulation and not having sex during ovulation. One way the woman can check for ovulation is to check her temperature.  Waiting to have sex until after ovulation. Summary  Contraception, also called birth control, means things to use or ways to try not to get pregnant.  Hormonal methods of birth control include implants, injections, pills, patches, vaginal rings, and emergency birth control pills.  Barrier methods of birth control can include female condoms, female condoms, diaphragms, cervical caps, sponges, and spermicides.  There are two types of IUD (intrauterine device) birth control. An IUD can be put in a woman's uterus to prevent pregnancy for 3-5 years.  Permanent sterilization can be done through a procedure for males, females, or both.  Natural planning methods involve not having sex on the days when the woman could get pregnant. This information is not intended to replace advice given to you by your health care provider. Make sure you discuss any questions you have with your health care provider. Document Revised: 01/25/2019 Document Reviewed: 10/15/2016 Elsevier Patient Education  2020 [employer].

## 2020-08-09 NOTE — Progress Notes (Signed)
Virtual Visit via Telephone Note  I connected with Gabrielle Byrd on 08/09/20 at  4:30 PM EDT by telephone and verified that I am speaking with the correct person using two identifiers.  Location:  Patient: Gabrielle Byrd (home)  Provider: Serafina Royals, CNM ([facility], [facility])   I discussed the limitations, risks, security and privacy concerns of performing an evaluation and management service by telephone and the availability of in person appointments. I also discussed with the patient that there may be a patient responsible charge related to this service. The patient expressed understanding and agreed to proceed.   History of Present Illness:  Patient is two (2) weeks status post spontaneous vaginal birth of first child.   Notes light/spotting. No issues with bladder or bowel. Eating without difficulty.   Breastfeeding with formula supplementation.   Sleeping well with good family support.   Denies difficulty breathing or respiratory distress, chest pain, abdominal pain, excessive vaginal bleeding, dysuria, and leg pain or swelling.   Observations/Objective:  Depression screen [facility] 2/9 08/09/2020 11/28/2019 11/22/2019  Decreased Interest 0 0 0  Down, Depressed, Hopeless 0 0 0  PHQ - 2 Score 0 0 0  Altered sleeping 0 - -  Tired, decreased energy 0 - -  Change in appetite 0 - -  Feeling bad or failure about yourself  0 - -  Trouble concentrating 0 - -  Moving slowly or fidgety/restless 0 - -  Suicidal thoughts 0 - -  PHQ-9 Score 0 - -   GAD 7 : Generalized Anxiety Score 08/09/2020  Nervous, Anxious, on Edge 0  Control/stop worrying 0  Worry too much - different things 0  Trouble relaxing 0  Restless 0  Easily annoyed or irritable 0  Afraid - awful might happen 0  Total GAD 7 Score 0    Assessment and Plan:  Postpartum care and examination Lactating mother Contraception counseling Negative depression screen  Follow Up Instructions:  Routine  postpartum education.   Education regarding progesterone only contraception methods.   Reviewed red flag symptoms and when to call.   RTC x 4 weeks for PPV as previously scheduled or sooner if needed.    I discussed the assessment and treatment plan with the patient. The patient was provided an opportunity to ask questions and all were answered. The patient agreed with the plan and demonstrated an understanding of the instructions.   The patient was advised to call back or seek an in-person evaluation if the symptoms worsen or if the condition fails to improve as anticipated.  I provided 6 minutes of non-face-to-face time during this encounter.   Serafina Royals, CNM  [facility], [facility] 08/09/20 3:28 PM

## 2020-09-05 ENCOUNTER — Ambulatory Visit (INDEPENDENT_AMBULATORY_CARE_PROVIDER_SITE_OTHER): Payer: Medicaid Other | Admitting: Certified Nurse Midwife

## 2020-09-05 ENCOUNTER — Encounter: Payer: Self-pay | Admitting: Certified Nurse Midwife

## 2020-09-05 ENCOUNTER — Other Ambulatory Visit: Payer: Self-pay

## 2020-09-05 DIAGNOSIS — Z1331 Encounter for screening for depression: Secondary | ICD-10-CM

## 2020-09-05 NOTE — Patient Instructions (Addendum)
Pap Test Why am I having this test? A Pap test, also called a Pap smear, is a screening test to check for signs of:  Cancer of the vagina, cervix, and uterus. The cervix is the lower part of the uterus that opens into the vagina.  Infection.  Changes that may be a sign that cancer is developing (precancerous changes). Women need this test on a regular basis. In general, you should have a Pap test every 3 years until you reach menopause or age 22. Women aged 30-60 may choose to have their Pap test done at the same time as an HPV (human papillomavirus) test every 5 years (instead of every 3 years). Your health care provider may recommend having Pap tests more or less often depending on your medical conditions and past Pap test results. What kind of sample is taken?  Your health care provider will collect a sample of cells from the surface of your cervix. This will be done using a small cotton swab, plastic spatula, or brush. This sample is often collected during a pelvic exam, when you are lying on your back on an exam table with feet in footrests (stirrups). In some cases, fluids (secretions) from the cervix or vagina may also be collected. How do I prepare for this test?  Be aware of where you are in your menstrual cycle. If you are menstruating on the day of the test, you may be asked to reschedule.  You may need to reschedule if you have a known vaginal infection on the day of the test.  Follow instructions from your health care provider about: ? Changing or stopping your regular medicines. Some medicines can cause abnormal test results, such as digitalis and tetracycline. ? Avoiding douching or taking a bath the day before or the day of the test. Tell a health care provider about:  Any allergies you have.  All medicines you are taking, including vitamins, herbs, eye drops, creams, and over-the-counter medicines.  Any blood disorders you have.  Any surgeries you have had.  Any  medical conditions you have.  Whether you are pregnant or may be pregnant. How are the results reported? Your test results will be reported as either abnormal or normal. A false-positive result can occur. A false positive is incorrect because it means that a condition is present when it is not. A false-negative result can occur. A false negative is incorrect because it means that a condition is not present when it is. What do the results mean? A normal test result means that you do not have signs of cancer of the vagina, cervix, or uterus. An abnormal result may mean that you have:  Cancer. A Pap test by itself is not enough to diagnose cancer. You will have more tests done in this case.  Precancerous changes in your vagina, cervix, or uterus.  Inflammation of the cervix.  An STD (sexually transmitted disease).  A fungal infection.  A parasite infection. Talk with your health care provider about what your results mean. Questions to ask your health care provider Ask your health care provider, or the department that is doing the test:  When will my results be ready?  How will I get my results?  What are my treatment options?  What other tests do I need?  What are my next steps? Summary  In general, women should have a Pap test every 3 years until they reach menopause or age 46.  Your health care provider will collect a  sample of cells from the surface of your cervix. This will be done using a small cotton swab, plastic spatula, or brush.  In some cases, fluids (secretions) from the cervix or vagina may also be collected. This information is not intended to replace advice given to you by your health care provider. Make sure you discuss any questions you have with your health care provider. Document Revised: 06/14/2017 Document Reviewed: 06/14/2017 Elsevier Patient Education  2020 [employer] Preventive Care 21-39 Years Old, Female Preventive care refers to visits with  your health care provider and lifestyle choices that can promote health and wellness. This includes:  A yearly physical exam. This may also be called an annual well check.  Regular dental visits and eye exams.  Immunizations.  Screening for certain conditions.  Healthy lifestyle choices, such as eating a healthy diet, getting regular exercise, not using drugs or products that contain nicotine and tobacco, and limiting alcohol use. What can I expect for my preventive care visit? Physical exam Your health care provider will check your:  Height and weight. This may be used to calculate body mass index (BMI), which tells if you are at a healthy weight.  Heart rate and blood pressure.  Skin for abnormal spots. Counseling Your health care provider may ask you questions about your:  Alcohol, tobacco, and drug use.  Emotional well-being.  Home and relationship well-being.  Sexual activity.  Eating habits.  Work and work environment.  Method of birth control.  Menstrual cycle.  Pregnancy history. What immunizations do I need?  Influenza (flu) vaccine  This is recommended every year. Tetanus, diphtheria, and pertussis (Tdap) vaccine  You may need a Td booster every 10 years. Varicella (chickenpox) vaccine  You may need this if you have not been vaccinated. Human papillomavirus (HPV) vaccine  If recommended by your health care provider, you may need three doses over 6 months. Measles, mumps, and rubella (MMR) vaccine  You may need at least one dose of MMR. You may also need a second dose. Meningococcal conjugate (MenACWY) vaccine  One dose is recommended if you are age 19-21 years and a first-year college student living in a residence hall, or if you have one of several medical conditions. You may also need additional booster doses. Pneumococcal conjugate (PCV13) vaccine  You may need this if you have certain conditions and were not previously  vaccinated. Pneumococcal polysaccharide (PPSV23) vaccine  You may need one or two doses if you smoke cigarettes or if you have certain conditions. Hepatitis A vaccine  You may need this if you have certain conditions or if you travel or work in places where you may be exposed to hepatitis A. Hepatitis B vaccine  You may need this if you have certain conditions or if you travel or work in places where you may be exposed to hepatitis B. Haemophilus influenzae type b (Hib) vaccine  You may need this if you have certain conditions. You may receive vaccines as individual doses or as more than one vaccine together in one shot (combination vaccines). Talk with your health care provider about the risks and benefits of combination vaccines. What tests do I need?  Blood tests  Lipid and cholesterol levels. These may be checked every 5 years starting at age 20.  Hepatitis C test.  Hepatitis B test. Screening  Diabetes screening. This is done by checking your blood sugar (glucose) after you have not eaten for a while (fasting).  Sexually transmitted disease (STD) testing.    testing.  BRCA-related cancer screening. This may be done if you have a family history of breast, ovarian, tubal, or peritoneal cancers.  Pelvic exam and Pap test. This may be done every 3 years starting at age 57. Starting at age 1, this may be done every 5 years if you have a Pap test in combination with an HPV test. Talk with your health care provider about your test results, treatment options, and if necessary, the need for more tests. Follow these instructions at home: Eating and drinking   Eat a diet that includes fresh fruits and vegetables, whole grains, lean protein, and low-fat dairy.  Take vitamin and mineral supplements as recommended by your health care provider.  Do not drink alcohol if: ? Your health care provider tells you not to drink. ? You are pregnant, may be pregnant, or are planning to become  pregnant.  If you drink alcohol: ? Limit how much you have to 0-1 drink a day. ? Be aware of how much alcohol is in your drink. In the [place] one drink equals one 12 oz bottle of beer (355 mL), one 5 oz glass of wine (148 mL), or one 1 oz glass of hard liquor (44 mL). Lifestyle  Take daily care of your teeth and gums.  Stay active. Exercise for at least 30 minutes on 5 or more days each week.  Do not use any products that contain nicotine or tobacco, such as cigarettes, e-cigarettes, and chewing tobacco. If you need help quitting, ask your health care provider.  If you are sexually active, practice safe sex. Use a condom or other form of birth control (contraception) in order to prevent pregnancy and STIs (sexually transmitted infections). If you plan to become pregnant, see your health care provider for a preconception visit. What's next?  Visit your health care provider once a year for a well check visit.  Ask your health care provider how often you should have your eyes and teeth checked.  Stay up to date on all vaccines. This information is not intended to replace advice given to you by your health care provider. Make sure you discuss any questions you have with your health care provider. Document Revised: 06/16/2018 Document Reviewed: 06/16/2018 Elsevier Patient Education  [place]  Natural Family Planning (NFP) is a type of birth control (contraception) in which no form of contraceptive medicine or device is used. The NFP method relies on knowing which days of the month a woman's ovary is producing an egg (ovulation). Ovulation is the time in the menstrual cycle when a woman is most fertile and, therefore, most likely to become pregnant. To lower the chance of pregnancy, sex is avoided during ovulation. NFP is a safe method of birth control and can prevent pregnancy if it is done correctly. However, NFP does not provide protection from sexually  transmitted diseases. NFP can also be used as a method of getting pregnant, by deciding to have sex during ovulation. How does the NFP method work? NFP works by making both sexual partners aware of how the woman's body functions during her menstrual cycle.  Usually, a woman has a menstrual period every 28-30 days. However, there can be 23-35 days between each menstrual period. This varies for each woman. A woman with a 28-day menstrual cycle has about 6 days a month when she is most likely to get pregnant.  Ovulation happens 12-14 days before the start of the next menstrual period. There  are different methods that are used to determine when ovulation starts.  An egg is fertile for 24 hours after it is released from the ovary. Sperm can live for 3 days or more. What NFP methods can be used to prevent pregnancy? The basal body temperature method During ovulation, there is often a slight increase in body temperature. To use this method:  Take your temperature every morning before getting out of bed. Write the temperature on a chart.  Do not have sex from the day the menstrual periods starts until 3 days after the increase in temperature. Note that body temperature may increase as a result of various factors, including fever, restless sleep, and working schedules. The cervical mucus method Right before ovulation, mucus from the lower part of the uterus (cervix) changes from dry and sticky to wet and slippery. To use this method:  Check the mucus every day to look for these changes. Ovulation happens on the last day of wet, slippery mucus.  Do not have sex starting when you first see wet, slippery mucus and until 4 days after it stops or returns to its normal consistency.  With this method, it is safe to have sex: ? After the 4 days have passed, and until 10 days after the menstrual period starts. ? On days when mucus is dry. Note that cervical mucus can increase or change consistency due to  reasons other than ovulation, such as infection, lubricants, some medicines, and sexual arousal. Other variations of the cervical mucus method include the TwoDay method, [place] ovulation method, and the [employer]. The symptothermal method This method combines the basal body temperature and the cervical mucus methods. The calendar method This method involves tracking menstrual cycles to determine when ovulation occurs. This method is helpful when the menstrual cycle varies in length. To use this method:  For 6 months, record when menstrual periods start and end and the length of each menstrual cycle. The length of a menstrual cycle is from day 1 of the present menstrual period to day 1 of the next menstrual period.  Use this information to determine when you will likely ovulate. Avoid sex during that time. You may need help from your health care provider to determine which days you are most likely to get pregnant. ? Ovulation usually happens 12-14 days before the start of the next menstrual period. ? Light vaginal bleeding (spotting) or abdominal cramps during the middle of a menstrual cycle may be signs of ovulation. However, not all women have these symptoms. The standard days method This method is based on studies of women's hormone levels throughout normal menstrual cycles. Based on these studies, a standard rule was developed to predict when women are most fertile during their menstrual cycle. According to the rule, if your cycle is 26-32 days long, you are most fertile between days 8 and 19. To prevent pregnancy, you should avoid having sex during this time, or use a barrier method of birth control. This method works best if your cycle is regularly between 26-32 days long. When should I not use the NFP method? You should not use NFP if:  You have very irregular menstrual periods or you sometimes skip a menstrual period.  You have abnormal vaginal bleeding.  You recently had a baby  or are breastfeeding.  You have a vaginal or cervical infection.  You take medicines that can affect vaginal mucus or body temperature. These medicines include antibiotics, thyroid medicines, and antihistamines that are found in some  cold and allergy medicines.  You absolutely do not want to become pregnant at the current time. Other methods of contraception are more effective at preventing pregnancy than NFP.  You are concerned about sexually transmitted diseases. Summary  Natural Family Planning methods help women and their partners to understand how to avoid pregnancy without using medicines or other methods.  A woman learns to recognize her most fertile days. A woman with a 28 day menstrual cycle has about 6 days per month when she can get pregnant. This information is not intended to replace advice given to you by your health care provider. Make sure you discuss any questions you have with your health care provider. Document Revised: 01/27/2019 Document Reviewed: 11/23/2016 Elsevier Patient Education  [place].

## 2020-09-05 NOTE — Progress Notes (Signed)
Subjective:    Gabrielle Byrd is a 24 y.o. [id number] African American female who presents for a postpartum visit. She is 6 weeks postpartum following a spontaneous vaginal delivery at 39+6 gestational weeks. Anesthesia: epidural. I have fully reviewed the prenatal and intrapartum course.   Postpartum course has been uncomplicated.   Baby's course has been uncomplicated. Baby is feeding by formula.   Bleeding no bleeding. Bowel function is normal. Bladder function is normal.   Patient is not sexually active. Contraception method is Natural Family Planing.   Postpartum depression screening: negative. Score 0.    Last pap 12/2019 and was abnormal, ASC-US/HRHPV+.  Denies difficulty breathing or respiratory distress, chest pain, abdominal pain, excessive vaginal bleeding, dysuria, and leg pain or swelling.   The following portions of the patient's history were reviewed and updated as appropriate: allergies, current medications, past medical history, past surgical history and problem list.  Review of Systems  Pertinent items are noted in HPI.   Objective:   BP 111/72   Pulse 81   Wt 230 lb 11.2 oz (104.6 kg)   LMP 08/28/2020   Breastfeeding No   BMI 40.87 kg/m   General:  alert, cooperative and no distress   Breasts:  deferred, no complaints  Lungs: clear to auscultation bilaterally  Heart:  regular rate and rhythm  Abdomen: soft, nontender   Vulva: normal  Vagina: normal vagina  Cervix:  closed  Corpus: Well-involuted  Adnexa:  Non-palpable        Depression screen [facility] 2/9 09/05/2020 08/09/2020 11/28/2019 11/22/2019  Decreased Interest 0 0 0 0  Down, Depressed, Hopeless 0 0 0 0  PHQ - 2 Score 0 0 0 0  Altered sleeping 0 0 - -  Tired, decreased energy 0 0 - -  Change in appetite 0 0 - -  Feeling bad or failure about yourself  0 0 - -  Trouble concentrating 0 0 - -  Moving slowly or fidgety/restless 0 0 - -  Suicidal thoughts 0 0 - -  PHQ-9 Score 0 0 - -   GAD 7 :  Generalized Anxiety Score 09/05/2020 08/09/2020  Nervous, Anxious, on Edge 0 0  Control/stop worrying 0 0  Worry too much - different things 0 0  Trouble relaxing 0 0  Restless 0 0  Easily annoyed or irritable 0 0  Afraid - awful might happen 0 0  Total GAD 7 Score 0 0     Assessment:   Postpartum exam Six (6) wks s/p spontaneous vaginal birth Formula feeding Depression screening Contraception counseling   Plan:   Encouraged routine health maintenance techniques, see AVS.   Discussed non-hormonal contraception option, patient wishes to proceed with [facility].   Reviewed red flag symptoms and when to call.   Follow up in: 4 months for ANNUAL EXAM and PAP or earlier if needed.   Serafina Royals, CNM [facility], [facility] 09/05/20 1:55 PM

## 2021-01-09 ENCOUNTER — Other Ambulatory Visit: Payer: Self-pay

## 2021-01-09 ENCOUNTER — Ambulatory Visit (INDEPENDENT_AMBULATORY_CARE_PROVIDER_SITE_OTHER): Payer: Medicaid Other | Admitting: Certified Nurse Midwife

## 2021-01-09 ENCOUNTER — Other Ambulatory Visit (HOSPITAL_COMMUNITY)
Admission: RE | Admit: 2021-01-09 | Discharge: 2021-01-09 | Disposition: A | Discharge status: Home / Self Care | Payer: Medicaid Other | Source: Ambulatory Visit | Attending: Certified Nurse Midwife | Admitting: Certified Nurse Midwife

## 2021-01-09 ENCOUNTER — Encounter: Payer: Self-pay | Admitting: Certified Nurse Midwife

## 2021-01-09 VITALS — BP 111/67 | HR 75 | Resp 16 | Ht 63.0 in | Wt 229.6 lb

## 2021-01-09 DIAGNOSIS — R87610 Atypical squamous cells of undetermined significance on cytologic smear of cervix (ASC-US): Secondary | ICD-10-CM

## 2021-01-09 DIAGNOSIS — Z01419 Encounter for gynecological examination (general) (routine) without abnormal findings: Secondary | ICD-10-CM | POA: Insufficient documentation

## 2021-01-09 DIAGNOSIS — Z124 Encounter for screening for malignant neoplasm of cervix: Secondary | ICD-10-CM | POA: Diagnosis not present

## 2021-01-09 DIAGNOSIS — Z8742 Personal history of other diseases of the female genital tract: Secondary | ICD-10-CM

## 2021-01-09 DIAGNOSIS — Z1159 Encounter for screening for other viral diseases: Secondary | ICD-10-CM | POA: Diagnosis not present

## 2021-01-09 DIAGNOSIS — Z113 Encounter for screening for infections with a predominantly sexual mode of transmission: Secondary | ICD-10-CM | POA: Diagnosis present

## 2021-01-09 DIAGNOSIS — R87810 Cervical high risk human papillomavirus (HPV) DNA test positive: Secondary | ICD-10-CM

## 2021-01-09 NOTE — Patient Instructions (Signed)
Preventive Care 21-23 Years Old, Female Preventive care refers to lifestyle choices and visits with your health care provider that can promote health and wellness. This includes:  A yearly physical exam. This is also called an annual wellness visit.  Regular dental and eye exams.  Immunizations.  Screening for certain conditions.  Healthy lifestyle choices, such as: ? Eating a healthy diet. ? Getting regular exercise. ? Not using drugs or products that contain nicotine and tobacco. ? Limiting alcohol use. What can I expect for my preventive care visit? Physical exam Your health care provider may check your:  Height and weight. These may be used to calculate your BMI (body mass index). BMI is a measurement that tells if you are at a healthy weight.  Heart rate and blood pressure.  Body temperature.  Skin for abnormal spots. Counseling Your health care provider may ask you questions about your:  Past medical problems.  Family's medical history.  Alcohol, tobacco, and drug use.  Emotional well-being.  Home life and relationship well-being.  Sexual activity.  Diet, exercise, and sleep habits.  Work and work environment.  Access to firearms.  Method of birth control.  Menstrual cycle.  Pregnancy history. What immunizations do I need? Vaccines are usually given at various ages, according to a schedule. Your health care provider will recommend vaccines for you based on your age, medical history, and lifestyle or other factors, such as travel or where you work.   What tests do I need? Blood tests  Lipid and cholesterol levels. These may be checked every 5 years starting at age 20.  Hepatitis C test.  Hepatitis B test. Screening  Diabetes screening. This is done by checking your blood sugar (glucose) after you have not eaten for a while (fasting).  STD (sexually transmitted disease) testing, if you are at risk.  BRCA-related cancer screening. This may be  done if you have a family history of breast, ovarian, tubal, or peritoneal cancers.  Pelvic exam and Pap test. This may be done every 3 years starting at age 21. Starting at age 30, this may be done every 5 years if you have a Pap test in combination with an HPV test. Talk with your health care provider about your test results, treatment options, and if necessary, the need for more tests.   Follow these instructions at home: Eating and drinking  Eat a healthy diet that includes fresh fruits and vegetables, whole grains, lean protein, and low-fat dairy products.  Take vitamin and mineral supplements as recommended by your health care provider.  Do not drink alcohol if: ? Your health care provider tells you not to drink. ? You are pregnant, may be pregnant, or are planning to become pregnant.  If you drink alcohol: ? Limit how much you have to 0-1 drink a day. ? Be aware of how much alcohol is in your drink. In the [place] one drink equals one 12 oz bottle of beer (355 mL), one 5 oz glass of wine (148 mL), or one 1 oz glass of hard liquor (44 mL).   Lifestyle  Take daily care of your teeth and gums. Brush your teeth every morning and night with fluoride toothpaste. Floss one time each day.  Stay active. Exercise for at least 30 minutes 5 or more days each week.  Do not use any products that contain nicotine or tobacco, such as cigarettes, e-cigarettes, and chewing tobacco. If you need help quitting, ask your health care provider.  Do not   use drugs.  If you are sexually active, practice safe sex. Use a condom or other form of protection to prevent STIs (sexually transmitted infections).  If you do not wish to become pregnant, use a form of birth control. If you plan to become pregnant, see your health care provider for a prepregnancy visit.  Find healthy ways to cope with stress, such as: ? Meditation, yoga, or listening to music. ? Journaling. ? Talking to a trusted  person. ? Spending time with friends and family. Safety  Always wear your seat belt while driving or riding in a vehicle.  Do not drive: ? If you have been drinking alcohol. Do not ride with someone who has been drinking. ? When you are tired or distracted. ? While texting.  Wear a helmet and other protective equipment during sports activities.  If you have firearms in your house, make sure you follow all gun safety procedures.  Seek help if you have been physically or sexually abused. What's next?  Go to your health care provider once a year for an annual wellness visit.  Ask your health care provider how often you should have your eyes and teeth checked.  Stay up to date on all vaccines. This information is not intended to replace advice given to you by your health care provider. Make sure you discuss any questions you have with your health care provider. Document Revised: 06/02/2020 Document Reviewed: 06/16/2018 Elsevier Patient Education  2021 Elsevier Inc.  

## 2021-01-09 NOTE — Progress Notes (Signed)
ANNUAL PREVENTATIVE CARE GYN  ENCOUNTER NOTE  Subjective:       Gabrielle Byrd is a 25 y.o. G56P1001 female here for a routine annual gynecologic exam.  Current complaints: 1. Needs Pap smear 2. Requests STI and vaginitis testing  Denies difficulty breathing or respiratory distress, chest pain, abdominal pain, excessive vaginal bleeding, dysuria, and leg pain or swelling.    Gynecologic History  Patient's last menstrual period was 12/15/2020 (exact date).  Contraception: coitus interruptus  Last Pap: 12/2019. Results were: abnormal, AS-CUS/HPV+  Obstetric History  OB History  Gravida Para Term Preterm AB Living  1 1 1  0 0 1  SAB IAB Ectopic Multiple Live Births  0 0 0 0 1    # Outcome Date GA Lbr Len/2nd Weight Sex Delivery Anes PTL Lv  1 Term 07/26/20 [redacted]w[redacted]d  7 lb 3.7 oz (3.28 kg) M Vag-Spont EPI  LIV     Birth Comments: small red marks on bottom of both feet    Past Medical History:  Diagnosis Date  . Anemia    Hx of  . Asthma   . Obesity (BMI 30-39.9)   . Rhinitis, allergic   . Tonsillitis    chronic    Past Surgical History:  Procedure Laterality Date  . TONSILLECTOMY Bilateral 03/30/2019   Procedure: TONSILLECTOMY;  Surgeon: 05/30/2019, MD;  Location: [facility];  Service: ENT;  Laterality: Bilateral;  . WISDOM TOOTH EXTRACTION      Current Outpatient Medications on File Prior to Visit  Medication Sig Dispense Refill  . ibuprofen (ADVIL) 600 MG tablet Take 1 tablet (600 mg total) by mouth every 6 (six) hours. 30 tablet 0  . Iron-FA-B Cmp-C-Biot-Probiotic (FUSION PLUS) CAPS Take 1 tablet by mouth daily. 30 capsule 6  . Prenatal Vit-Fe Fumarate-FA (PRENATAL VITAMIN) 27-0.8 MG TABS Take 1 tablet by mouth daily at 6 (six) AM. 100 tablet 0  . clindamycin-benzoyl peroxide (BENZACLIN) gel Apply topically 2 (two) times daily. (Patient not taking: No sig reported)    . PROAIR HFA 108 (90 Base) MCG/ACT inhaler USE 2 INHALATIONS EVERY FOUR TO SIX HOURS AS  NEEDED (Patient not taking: No sig reported)  5   No current facility-administered medications on file prior to visit.    No Known Allergies  Social History   Socioeconomic History  . Marital status: Single    Spouse name: Not on file  . Number of children: Not on file  . Years of education: Not on file  . Highest education level: Not on file  Occupational History  . Not on file  Tobacco Use  . Smoking status: Never Smoker  . Smokeless tobacco: Never Used  Vaping Use  . Vaping Use: Never used  Substance and Sexual Activity  . Alcohol use: Not Currently    Comment: Last ETOH use 4 - 5 weeks ago.  . Drug use: Not Currently    Types: Marijuana    Comment: Last marijuana use 2 weeks ago  . Sexual activity: Not Currently    Birth control/protection: None    Comment: unsure- natural family panning?  Other Topics Concern  . Not on file  Social History Narrative  . Not on file   Social Determinants of Health   Financial Resource Strain: Not on file  Food Insecurity: Not on file  Transportation Needs: Not on file  Physical Activity: Not on file  Stress: Not on file  Social Connections: Not on file  Intimate Partner Violence: Not on file  Family History  Problem Relation Age of Onset  . Asthma Mother   . Rheum arthritis Mother   . Hypertension Mother   . Depression Mother   . Heart failure Father   . Hypertension Father   . Heart disease Father   . Diabetes Maternal Grandmother   . Rheum arthritis Maternal Grandmother   . Heart failure Maternal Grandfather   . Cancer Maternal Grandfather   . Rheum arthritis Maternal Grandfather   . Ovarian cancer Maternal Aunt   . Breast cancer Neg Hx   . Colon cancer Neg Hx     The following portions of the patient's history were reviewed and updated as appropriate: allergies, current medications, past family history, past medical history, past social history, past surgical history and problem list.  Review of  Systems  ROS negative except as noted above. Information obtained from patient.    Objective:   BP 111/67   Pulse 75   Resp 16   Ht 5\' 3"  (1.6 m)   Wt 229 lb 9.6 oz (104.1 kg)   BMI 40.67 kg/m    CONSTITUTIONAL: Well-developed, well-nourished female in no acute distress.   PSYCHIATRIC: Normal mood and affect. Normal behavior. Normal judgment and thought content.  NEUROLGIC: Alert and oriented to person, place, and time. Normal muscle tone coordination. No cranial nerve deficit noted.  HENT:  Normocephalic, atraumatic.   EYES: Conjunctivae and EOM are normal.   NECK: Normal range of motion, supple, no masses.  Normal thyroid.  SKIN: Skin is warm and dry. No rash noted. Not diaphoretic. No erythema. No pallor. Professional tattoos present.   CARDIOVASCULAR: Normal heart rate noted, regular rhythm, no murmur.  RESPIRATORY: Clear to auscultation bilaterally. Effort and breath sounds normal, no problems with respiration noted.  BREASTS: Symmetric in size. No masses, skin changes, nipple drainage, or lymphadenopathy. Bilateral nipple piercings present.  ABDOMEN: Soft, normal bowel sounds, no distention noted.  No tenderness, rebound or guarding.   PELVIC:  External Genitalia: Normal  Vagina: Normal  Cervix: Normal, Pap collected  Uterus: Normal  Adnexa: Normal  MUSCULOSKELETAL: Normal range of motion. No tenderness.  No cyanosis, clubbing, or edema.  2+ distal pulses.  LYMPHATIC: No Axillary, Supraclavicular, or Inguinal Adenopathy.  Assessment:   Annual gynecologic examination 25 y.o.   Contraception: coitus interruptus   Obesity 3   Problem List Items Addressed This Visit   None   Visit Diagnoses    Well woman exam    -  Primary   Relevant Orders   Cervicovaginal ancillary only   Cervical cancer screening       Relevant Orders   Cytology - PAP   Need for hepatitis C screening test       Atypical squamous cell changes of undetermined significance (ASCUS)  on cervical cytology with positive high risk human papilloma virus (HPV)       Routine screening for STI (sexually transmitted infection)       Relevant Orders   Cervicovaginal ancillary only   History of vaginitis       Relevant Orders   Cervicovaginal ancillary only      Plan:   Pap: Pap, Reflex if ASCUS  Labs: See orders  Routine preventative health maintenance measures emphasized: Exercise/Diet/Weight control, Tobacco Warnings, Alcohol/Substance use risks and Stress Management; see AVS  Reviewed red flag symptoms and when to call  Return to Clinic - 1 Year for 21 or sooner if needed   [employer], CNM  [facility],  [facility] 01/09/21 11:14 AM

## 2021-01-10 ENCOUNTER — Other Ambulatory Visit: Payer: Self-pay

## 2021-01-10 LAB — CERVICOVAGINAL ANCILLARY ONLY
Bacterial Vaginitis (gardnerella): NEGATIVE
Candida Glabrata: NEGATIVE
Candida Vaginitis: POSITIVE — AB
Chlamydia: NEGATIVE
Comment: NEGATIVE
Comment: NEGATIVE
Comment: NEGATIVE
Comment: NEGATIVE
Comment: NEGATIVE
Comment: NORMAL
Neisseria Gonorrhea: NEGATIVE
Trichomonas: NEGATIVE

## 2021-01-10 MED ORDER — FLUCONAZOLE 150 MG PO TABS: 150.0000 mg | ORAL_TABLET | Freq: Every day | ORAL | 1 refills | Status: DC

## 2021-01-13 LAB — CYTOLOGY - PAP: Diagnosis: NEGATIVE

## 2021-03-27 ENCOUNTER — Ambulatory Visit
Admission: EM | Admit: 2021-03-27 | Discharge: 2021-03-27 | Disposition: A | Discharge status: Home / Self Care | Payer: Medicaid Other | Attending: Emergency Medicine | Admitting: Emergency Medicine

## 2021-03-27 ENCOUNTER — Other Ambulatory Visit: Payer: Self-pay

## 2021-03-27 DIAGNOSIS — S29019A Strain of muscle and tendon of unspecified wall of thorax, initial encounter: Secondary | ICD-10-CM

## 2021-03-27 DIAGNOSIS — S161XXA Strain of muscle, fascia and tendon at neck level, initial encounter: Secondary | ICD-10-CM | POA: Diagnosis not present

## 2021-03-27 MED ORDER — BACLOFEN 10 MG PO TABS: 10.0000 mg | ORAL_TABLET | Freq: Three times a day (TID) | ORAL | 0 refills | Status: DC

## 2021-03-27 MED ORDER — DICLOFENAC SODIUM 75 MG PO TBEC: 75.0000 mg | DELAYED_RELEASE_TABLET | Freq: Two times a day (BID) | ORAL | 0 refills | Status: AC
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
## 2021-03-27 NOTE — Discharge Instructions (Addendum)
Take the Diclofenac twice daily to help with pain.  Use the Baclofen 3 times a day as needed for muscle tension.  Increase your oral fluid intake to flush the lactic acid from your muscles.  Follow the home PT exercises given in your discharge paperwork.

## 2021-04-02 ENCOUNTER — Other Ambulatory Visit: Payer: Self-pay

## 2021-04-02 ENCOUNTER — Emergency Department
Admission: EM | Admit: 2021-04-02 | Discharge: 2021-04-02 | Disposition: A | Discharge status: Home / Self Care | Payer: Medicaid Other | Attending: Emergency Medicine | Admitting: Emergency Medicine

## 2021-04-02 ENCOUNTER — Emergency Department: Payer: Medicaid Other

## 2021-04-02 ENCOUNTER — Encounter: Payer: Self-pay | Admitting: Intensive Care

## 2021-04-02 DIAGNOSIS — R519 Headache, unspecified: Secondary | ICD-10-CM | POA: Insufficient documentation

## 2021-04-02 DIAGNOSIS — J45909 Unspecified asthma, uncomplicated: Secondary | ICD-10-CM | POA: Insufficient documentation

## 2021-04-02 DIAGNOSIS — Y92410 Unspecified street and highway as the place of occurrence of the external cause: Secondary | ICD-10-CM | POA: Diagnosis not present

## 2021-04-02 DIAGNOSIS — M545 Low back pain, unspecified: Secondary | ICD-10-CM | POA: Diagnosis not present

## 2021-04-02 DIAGNOSIS — M542 Cervicalgia: Secondary | ICD-10-CM | POA: Diagnosis not present

## 2021-04-02 LAB — POC URINE PREG, ED
Preg Test, Ur: NEGATIVE
Preg Test, Ur: NEGATIVE

## 2021-04-02 MED ORDER — LIDOCAINE 5 % EX PTCH
2.0000 | MEDICATED_PATCH | CUTANEOUS | Status: DC
  Administered 2021-04-02: 2 via TRANSDERMAL
  Filled 2021-04-02: qty 2

## 2021-04-02 MED ORDER — CYCLOBENZAPRINE HCL 5 MG PO TABS: 5.0000 mg | ORAL_TABLET | Freq: Three times a day (TID) | ORAL | 0 refills | Status: AC | PRN

## 2021-04-02 MED ORDER — ACETAMINOPHEN 500 MG PO TABS
1000.0000 mg | ORAL_TABLET | Freq: Once | ORAL | Status: AC
  Administered 2021-04-02: 1000 mg via ORAL
  Filled 2021-04-02: qty 2

## 2021-04-02 MED ORDER — KETOROLAC TROMETHAMINE 60 MG/2ML IM SOLN
60.0000 mg | Freq: Once | INTRAMUSCULAR | Status: AC
  Administered 2021-04-02: 60 mg via INTRAMUSCULAR
  Filled 2021-04-02: qty 2

## 2021-04-02 MED ORDER — CYCLOBENZAPRINE HCL 10 MG PO TABS
5.0000 mg | ORAL_TABLET | Freq: Once | ORAL | Status: AC
  Administered 2021-04-02: 5 mg via ORAL
  Filled 2021-04-02: qty 1
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
## 2022-01-12 ENCOUNTER — Encounter: Payer: Medicaid Other | Admitting: Certified Nurse Midwife
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
## 2022-08-04 ENCOUNTER — Encounter: Payer: Self-pay | Admitting: Certified Nurse Midwife
